# Patient Record
Sex: Female | Born: 1955 | Race: White | Hispanic: No | Marital: Married | Smoking: Never smoker
Health system: Southern US, Community
[De-identification: ages and names within clinical notes are randomized; demographics above are authoritative.]

## PROBLEM LIST (undated history)

## (undated) DIAGNOSIS — K219 Gastro-esophageal reflux disease without esophagitis: Secondary | ICD-10-CM

## (undated) DIAGNOSIS — E8881 Metabolic syndrome: Secondary | ICD-10-CM

## (undated) DIAGNOSIS — F32A Depression, unspecified: Secondary | ICD-10-CM

## (undated) DIAGNOSIS — F329 Major depressive disorder, single episode, unspecified: Secondary | ICD-10-CM

## (undated) DIAGNOSIS — R011 Cardiac murmur, unspecified: Secondary | ICD-10-CM

## (undated) DIAGNOSIS — I272 Pulmonary hypertension, unspecified: Secondary | ICD-10-CM

## (undated) DIAGNOSIS — R109 Unspecified abdominal pain: Secondary | ICD-10-CM

## (undated) DIAGNOSIS — C801 Malignant (primary) neoplasm, unspecified: Secondary | ICD-10-CM

## (undated) DIAGNOSIS — E669 Obesity, unspecified: Secondary | ICD-10-CM

## (undated) DIAGNOSIS — I10 Essential (primary) hypertension: Secondary | ICD-10-CM

## (undated) DIAGNOSIS — R569 Unspecified convulsions: Secondary | ICD-10-CM

## (undated) DIAGNOSIS — E559 Vitamin D deficiency, unspecified: Secondary | ICD-10-CM

## (undated) DIAGNOSIS — R5383 Other fatigue: Secondary | ICD-10-CM

## (undated) DIAGNOSIS — E785 Hyperlipidemia, unspecified: Secondary | ICD-10-CM

## (undated) DIAGNOSIS — D509 Iron deficiency anemia, unspecified: Secondary | ICD-10-CM

## (undated) HISTORY — PX: THYROIDECTOMY, PARTIAL: SHX18

## (undated) HISTORY — PX: APPENDECTOMY: SHX54

## (undated) HISTORY — PX: CHOLECYSTECTOMY: SHX55

## (undated) HISTORY — PX: GASTRIC BYPASS: SHX52

## (undated) HISTORY — PX: ABDOMINAL HYSTERECTOMY: SHX81

---

## 2012-04-18 ENCOUNTER — Emergency Department (HOSPITAL_BASED_OUTPATIENT_CLINIC_OR_DEPARTMENT_OTHER): Payer: BC Managed Care – PPO

## 2012-04-18 ENCOUNTER — Encounter (HOSPITAL_BASED_OUTPATIENT_CLINIC_OR_DEPARTMENT_OTHER): Payer: Self-pay | Admitting: Emergency Medicine

## 2012-04-18 ENCOUNTER — Emergency Department (HOSPITAL_BASED_OUTPATIENT_CLINIC_OR_DEPARTMENT_OTHER)
Admission: EM | Admit: 2012-04-18 | Discharge: 2012-04-18 | Disposition: A | Discharge status: Home / Self Care | Payer: BC Managed Care – PPO | Attending: Emergency Medicine | Admitting: Emergency Medicine

## 2012-04-18 DIAGNOSIS — F329 Major depressive disorder, single episode, unspecified: Secondary | ICD-10-CM | POA: Insufficient documentation

## 2012-04-18 DIAGNOSIS — R112 Nausea with vomiting, unspecified: Secondary | ICD-10-CM | POA: Insufficient documentation

## 2012-04-18 DIAGNOSIS — Z862 Personal history of diseases of the blood and blood-forming organs and certain disorders involving the immune mechanism: Secondary | ICD-10-CM | POA: Insufficient documentation

## 2012-04-18 DIAGNOSIS — Z79899 Other long term (current) drug therapy: Secondary | ICD-10-CM | POA: Insufficient documentation

## 2012-04-18 DIAGNOSIS — F3289 Other specified depressive episodes: Secondary | ICD-10-CM | POA: Insufficient documentation

## 2012-04-18 DIAGNOSIS — E8881 Metabolic syndrome: Secondary | ICD-10-CM | POA: Insufficient documentation

## 2012-04-18 DIAGNOSIS — I2789 Other specified pulmonary heart diseases: Secondary | ICD-10-CM | POA: Insufficient documentation

## 2012-04-18 DIAGNOSIS — Z8639 Personal history of other endocrine, nutritional and metabolic disease: Secondary | ICD-10-CM | POA: Insufficient documentation

## 2012-04-18 DIAGNOSIS — R1013 Epigastric pain: Secondary | ICD-10-CM

## 2012-04-18 DIAGNOSIS — E669 Obesity, unspecified: Secondary | ICD-10-CM | POA: Insufficient documentation

## 2012-04-18 DIAGNOSIS — K2970 Gastritis, unspecified, without bleeding: Secondary | ICD-10-CM | POA: Insufficient documentation

## 2012-04-18 DIAGNOSIS — K219 Gastro-esophageal reflux disease without esophagitis: Secondary | ICD-10-CM | POA: Insufficient documentation

## 2012-04-18 DIAGNOSIS — Z8669 Personal history of other diseases of the nervous system and sense organs: Secondary | ICD-10-CM | POA: Insufficient documentation

## 2012-04-18 DIAGNOSIS — E785 Hyperlipidemia, unspecified: Secondary | ICD-10-CM | POA: Insufficient documentation

## 2012-04-18 DIAGNOSIS — R011 Cardiac murmur, unspecified: Secondary | ICD-10-CM | POA: Insufficient documentation

## 2012-04-18 HISTORY — DX: Hyperlipidemia, unspecified: E78.5

## 2012-04-18 HISTORY — DX: Other fatigue: R53.83

## 2012-04-18 HISTORY — DX: Cardiac murmur, unspecified: R01.1

## 2012-04-18 HISTORY — DX: Depression, unspecified: F32.A

## 2012-04-18 HISTORY — DX: Obesity, unspecified: E66.9

## 2012-04-18 HISTORY — DX: Gastro-esophageal reflux disease without esophagitis: K21.9

## 2012-04-18 HISTORY — DX: Unspecified convulsions: R56.9

## 2012-04-18 HISTORY — DX: Unspecified abdominal pain: R10.9

## 2012-04-18 HISTORY — DX: Metabolic syndrome: E88.81

## 2012-04-18 HISTORY — DX: Iron deficiency anemia, unspecified: D50.9

## 2012-04-18 HISTORY — DX: Vitamin D deficiency, unspecified: E55.9

## 2012-04-18 HISTORY — DX: Major depressive disorder, single episode, unspecified: F32.9

## 2012-04-18 HISTORY — DX: Metabolic syndrome: E88.810

## 2012-04-18 HISTORY — DX: Pulmonary hypertension, unspecified: I27.20

## 2012-04-18 HISTORY — DX: Essential (primary) hypertension: I10

## 2012-04-18 LAB — CBC WITH DIFFERENTIAL/PLATELET
Basophils Absolute: 0 10*3/uL (ref 0.0–0.1)
HCT: 38.5 % (ref 36.0–46.0)
Lymphocytes Relative: 15 % (ref 12–46)
Monocytes Absolute: 0.9 10*3/uL (ref 0.1–1.0)
Neutro Abs: 7.2 10*3/uL (ref 1.7–7.7)
Neutrophils Relative %: 75 % (ref 43–77)
Platelets: 183 10*3/uL (ref 150–400)
RDW: 13.5 % (ref 11.5–15.5)
WBC: 9.6 10*3/uL (ref 4.0–10.5)

## 2012-04-18 LAB — URINALYSIS, ROUTINE W REFLEX MICROSCOPIC
Bilirubin Urine: NEGATIVE
Glucose, UA: NEGATIVE mg/dL
Ketones, ur: NEGATIVE mg/dL
Leukocytes, UA: NEGATIVE
Specific Gravity, Urine: 1.021 (ref 1.005–1.030)
pH: 5 (ref 5.0–8.0)

## 2012-04-18 LAB — COMPREHENSIVE METABOLIC PANEL
ALT: 59 U/L — ABNORMAL HIGH (ref 0–35)
AST: 17 U/L (ref 0–37)
CO2: 21 mEq/L (ref 19–32)
Chloride: 95 mEq/L — ABNORMAL LOW (ref 96–112)
GFR calc non Af Amer: 63 mL/min — ABNORMAL LOW (ref >90)
Potassium: 4.6 mEq/L (ref 3.5–5.1)
Sodium: 128 mEq/L — ABNORMAL LOW (ref 135–145)
Total Bilirubin: 0.3 mg/dL (ref 0.3–1.2)

## 2012-04-18 MED ORDER — HYDROMORPHONE HCL PF 1 MG/ML IJ SOLN
1.0000 mg | Freq: Once | INTRAMUSCULAR | Status: AC
  Administered 2012-04-18: 1 mg via INTRAVENOUS
  Filled 2012-04-18: qty 1

## 2012-04-18 MED ORDER — GI COCKTAIL ~~LOC~~
30.0000 mL | Freq: Once | ORAL | Status: AC
  Administered 2012-04-18: 30 mL via ORAL
  Filled 2012-04-18: qty 30

## 2012-04-18 MED ORDER — PROMETHAZINE HCL 25 MG/ML IJ SOLN
25.0000 mg | Freq: Once | INTRAMUSCULAR | Status: AC
  Administered 2012-04-18: 25 mg via INTRAVENOUS
  Filled 2012-04-18: qty 1

## 2012-04-18 MED ORDER — HYDROMORPHONE HCL PF 1 MG/ML IJ SOLN
1.0000 mg | Freq: Once | INTRAMUSCULAR | Status: AC
  Administered 2012-04-18: 1 mg via INTRAVENOUS
  Filled 2012-04-18 (×2): qty 1

## 2012-04-18 MED ORDER — ONDANSETRON HCL 4 MG/2ML IJ SOLN
4.0000 mg | Freq: Once | INTRAMUSCULAR | Status: AC
  Administered 2012-04-18: 4 mg via INTRAVENOUS
  Filled 2012-04-18: qty 2

## 2012-04-18 MED ORDER — LOPERAMIDE HCL 2 MG PO CAPS: ORAL_CAPSULE | ORAL | Status: DC

## 2012-04-18 MED ORDER — IOHEXOL 300 MG/ML  SOLN
100.0000 mL | Freq: Once | INTRAMUSCULAR | Status: AC | PRN
  Administered 2012-04-18: 100 mL via INTRAVENOUS

## 2012-04-18 MED ORDER — HYDROMORPHONE HCL PF 1 MG/ML IJ SOLN
1.0000 mg | Freq: Once | INTRAMUSCULAR | Status: AC
  Administered 2012-04-18: 1 mg via INTRAVENOUS

## 2012-04-18 MED ORDER — SODIUM CHLORIDE 0.9 % IV BOLUS (SEPSIS)
1000.0000 mL | Freq: Once | INTRAVENOUS | Status: AC
  Administered 2012-04-18: 1000 mL via INTRAVENOUS

## 2012-04-18 MED ORDER — ONDANSETRON HCL 4 MG PO TABS: 4.0000 mg | ORAL_TABLET | Freq: Four times a day (QID) | ORAL | Status: DC

## 2012-04-18 MED ORDER — GI COCKTAIL ~~LOC~~: 30.0000 mL | Freq: Two times a day (BID) | ORAL | Status: DC

## 2012-04-18 MED ORDER — OXYCODONE-ACETAMINOPHEN 5-325 MG PO TABS: 1.0000 | ORAL_TABLET | Freq: Four times a day (QID) | ORAL | Status: DC | PRN

## 2012-04-18 MED ORDER — PANTOPRAZOLE SODIUM 40 MG IV SOLR
40.0000 mg | Freq: Once | INTRAVENOUS | Status: AC
  Administered 2012-04-18: 40 mg via INTRAVENOUS
  Filled 2012-04-18: qty 40

## 2012-04-18 MED ORDER — IOHEXOL 300 MG/ML  SOLN
25.0000 mL | INTRAMUSCULAR | Status: AC
  Administered 2012-04-18 (×2): 25 mL via ORAL

## 2012-04-18 MED ORDER — DICYCLOMINE HCL 20 MG PO TABS: 20.0000 mg | ORAL_TABLET | Freq: Two times a day (BID) | ORAL | Status: AC

## 2012-04-18 MED ORDER — DICYCLOMINE HCL 10 MG PO CAPS
10.0000 mg | ORAL_CAPSULE | Freq: Once | ORAL | Status: AC
  Administered 2012-04-18: 10 mg via ORAL
  Filled 2012-04-18: qty 1

## 2012-04-18 NOTE — ED Notes (Signed)
Pt's BP decreased s/p dilaudid & Phenergan - bolus started, Tia, PA notified, pt will not be d/c'd until BPs are stable.

## 2012-04-18 NOTE — ED Provider Notes (Signed)
History     CSN: [id number]  Arrival date & time 04/18/12  1210   First MD Initiated Contact with Patient 04/18/12 1222      Chief Complaint  Patient presents with  . Abdominal Pain    (Consider location/radiation/quality/duration/timing/severity/associated sxs/prior treatment) HPI Comments: Patient with history of GERD, presents with complaint of epigastric pain X 8 days. Associated symptoms include nausea, vomiting, and a "burning raw" sensation in her throat. Patient states that she was seen at [place] [facility], given a GI cocktail and sent home. She then went to her PCP who recommended nexium, also no improvement. Of note patient has had multiple abdominal surgeries to include cholecystectomy, appendectomy, gastric bypass, and an abdominal hysterectomy. Denies fever or chills. Denies hematemesis or hematochezia. Denies CP or SOB.   The history is provided by the patient. No language interpreter was used.    Past Medical History  Diagnosis Date  . Dysmetabolic syndrome X   . Seizures   . Iron deficiency anemia   . Heart murmur   . Obesity   . Hyperlipemia   . Pulmonary hypertension   . Depression   . GERD (gastroesophageal reflux disease)   . Vitamin D deficiency   . Fatigue   . Hypertension   . Abdominal pain     Past Surgical History  Procedure Date  . Abdominal hysterectomy   . Appendectomy   . Cholecystectomy   . Gastric bypass     No family history on file.  History  Substance Use Topics  . Smoking status: Not on file  . Smokeless tobacco: Not on file  . Alcohol Use:     OB History    Grav Para Term Preterm Abortions TAB SAB Ect Mult Living                  Review of Systems  Constitutional: Negative for fever and chills.  Respiratory: Negative for shortness of breath.   Cardiovascular: Negative for chest pain.  Gastrointestinal: Positive for nausea, vomiting and abdominal pain. Negative for diarrhea.    Allergies  Review of patient's  allergies indicates no known allergies.  Home Medications   Current Outpatient Rx  Name  Route  Sig  Dispense  Refill  . CITALOPRAM HYDROBROMIDE 20 MG PO TABS   Oral   Take 20 mg by mouth daily.         Marland Kitchen CLONIDINE HCL 0.1 MG PO TABS   Oral   Take 0.1 mg by mouth 3 (three) times daily.         Marland Kitchen ESOMEPRAZOLE MAGNESIUM 40 MG PO CPDR   Oral   Take 40 mg by mouth daily before breakfast.         . LISINOPRIL-HYDROCHLOROTHIAZIDE 20-25 MG PO TABS   Oral   Take 1 tablet by mouth daily.         Marland Kitchen SIMETHICONE 62.5 MG PO STRP   Oral   Take 2 strips by mouth daily.         Marland Kitchen TAPENTADOL HCL 50 MG PO TABS   Oral   Take 300 mg by mouth 2 (two) times daily.         Marland Kitchen ZOLPIDEM TARTRATE ER 12.5 MG PO TBCR   Oral   Take 12.5 mg by mouth at bedtime as needed.           BP 94/70  Pulse 98  Temp 98 F (36.7 C) (Oral)  Resp 16  Ht 5\' 7"  (1.702  m)  Wt 231 lb (104.781 kg)  BMI 36.18 kg/m2  SpO2 96%  Physical Exam  Nursing note and vitals reviewed. Constitutional: She is oriented to person, place, and time. She appears well-developed and well-nourished.  HENT:  Head: Normocephalic and atraumatic.  Mouth/Throat: Oropharynx is clear and moist. No oropharyngeal exudate.  Eyes: Conjunctivae normal and EOM are normal. No scleral icterus.  Neck: Normal range of motion. Neck supple.  Cardiovascular: Normal rate, regular rhythm and normal heart sounds.   Pulmonary/Chest: Effort normal and breath sounds normal.  Abdominal: Soft. Bowel sounds are normal. She exhibits no distension and no mass. There is tenderness. There is no rebound and no guarding.       Tenderness to palpation of the epigastrium and LUQ.  Lymphadenopathy:    She has no cervical adenopathy.  Neurological: She is alert and oriented to person, place, and time.  Skin: Skin is warm and dry.    ED Course  Procedures (including critical care time)   Labs Reviewed  CBC WITH DIFFERENTIAL  COMPREHENSIVE  METABOLIC PANEL  LIPASE, BLOOD  LACTIC ACID, PLASMA  URINALYSIS, ROUTINE W REFLEX MICROSCOPIC   Date: 04/18/2012  Rate: 86  Rhythm: normal sinus rhythm  QRS Axis: normal  Intervals: normal  ST/T Wave abnormalities: normal  Conduction Disutrbances:none  Narrative Interpretation: NO STEMI  Old EKG Reviewed: none available   Ct Abdomen Pelvis W Contrast  04/18/2012  *RADIOLOGY REPORT*  Clinical Data: Right-sided abdominal pain with nausea, vomiting, diarrhea.  CT ABDOMEN AND PELVIS WITH CONTRAST  Technique:  Multidetector CT imaging of the abdomen and pelvis was performed following the standard protocol during bolus administration of intravenous contrast.  Contrast: OMNIPAQUE IOHEXOL 300 MG/ML  SOLN  Comparison: Radiographs dated 04/18/2012  Findings: The liver, spleen, pancreas, right adrenal gland, and kidneys are normal.  Gallbladder has been removed.  No significant dilatation of the biliary tree.  The cecum lies in the right upper quadrant anterior to the right lobe of the liver.  Colon is quite redundant but otherwise normal. No diverticular disease.  No free air or free fluid.  Terminal ileum is normal.  Evidence of previous gastric bypass.  No dilated small bowel.  There is a 3.7 cm mass arising from the superior aspect of the left adrenal gland.  This is unchanged since prior chest CT of 10/31/2011.  It is relatively low density and most likely an adrenal adenoma.  The patient has a known history of this mass.  IMPRESSION: No acute abnormality of the abdomen.  Cecum lies in the right upper quadrant.  Stable left adrenal mass.   Original Report Authenticated By: Francene Boyers, M.D.    Dg Abd Acute W/chest  04/18/2012  *RADIOLOGY REPORT*  Clinical Data: Abdominal pain.  ACUTE ABDOMEN SERIES (ABDOMEN 2 VIEW & CHEST 1 VIEW)  Comparison: None.  Findings: Contrast is seen within nondilated small bowel loops in the right abdomen.  There is no evidence of dilated bowel loops or free  intraperitoneal air.  Colonic interposition between the right hemidiaphragm and liver is noted.  Right upper quadrant surgical clips are seen from prior cholecystectomy.  Low lung volumes are seen however both lungs are clear.  Heart size mediastinal contours are within normal limits.  IMPRESSION:  1.  Unremarkable bowel gas pattern.  No acute findings. 2.  Low lung volumes.  No active lung disease.   Original Report Authenticated By: Myles Rosenthal, M.D.      1. Epigastric abdominal pain  2. Gastritis       MDM  Patient presented with complaint of abdominal pain. Seen at [facility] clinic and physician there concerned about free air under the diaphragm based on results of acute abdominal series.. Labs and imaging repeated. Unremarkable for air under the diaphragm. Lipase elevated but imaging unremarkable for pancreatitis. Pain well controlled and patient feels ready for discharge. Patient has follow-up for upper endoscopy on Thurs. Discharged with pain medication, GI cocktail, Bentyl, and Zofran. Visit shared with Dr. Manus Gunning who agrees with this plan.         Pixie Casino, PA-C 04/18/12 1717

## 2012-04-18 NOTE — ED Notes (Signed)
Pt sent from [facility] for further evaluation of Abdominal pain.  Pt having burning in her chest and neck.  Pt also states she has acid taste in mouth.

## 2012-04-18 NOTE — ED Notes (Signed)
Pt drinking water at bedside

## 2012-04-19 NOTE — ED Provider Notes (Signed)
Medical screening examination/treatment/procedure(s) were conducted as a shared visit with non-physician practitioner(s) and myself.  I personally evaluated the patient during the encounter  Epigastric pain x 1 week with "burning" in throat.  Sent from UC with concern for free air. None seen here. Abdomen soft. Epigastric tenderness, no peritoneal signs.  Hx gastric bypass.  EKG nonischemic. Needs CT.  Glynn Octave, MD 04/19/12 [phone]

## 2013-08-04 ENCOUNTER — Emergency Department (HOSPITAL_BASED_OUTPATIENT_CLINIC_OR_DEPARTMENT_OTHER): Payer: BC Managed Care – PPO

## 2013-08-04 ENCOUNTER — Encounter (HOSPITAL_BASED_OUTPATIENT_CLINIC_OR_DEPARTMENT_OTHER): Payer: Self-pay | Admitting: Emergency Medicine

## 2013-08-04 ENCOUNTER — Emergency Department (HOSPITAL_BASED_OUTPATIENT_CLINIC_OR_DEPARTMENT_OTHER)
Admission: EM | Admit: 2013-08-04 | Discharge: 2013-08-04 | Disposition: A | Discharge status: Home / Self Care | Payer: BC Managed Care – PPO | Attending: Emergency Medicine | Admitting: Emergency Medicine

## 2013-08-04 DIAGNOSIS — S298XXA Other specified injuries of thorax, initial encounter: Secondary | ICD-10-CM | POA: Insufficient documentation

## 2013-08-04 DIAGNOSIS — F3289 Other specified depressive episodes: Secondary | ICD-10-CM | POA: Insufficient documentation

## 2013-08-04 DIAGNOSIS — Y9301 Activity, walking, marching and hiking: Secondary | ICD-10-CM | POA: Insufficient documentation

## 2013-08-04 DIAGNOSIS — IMO0002 Reserved for concepts with insufficient information to code with codable children: Secondary | ICD-10-CM | POA: Insufficient documentation

## 2013-08-04 DIAGNOSIS — Z79899 Other long term (current) drug therapy: Secondary | ICD-10-CM | POA: Insufficient documentation

## 2013-08-04 DIAGNOSIS — Y9289 Other specified places as the place of occurrence of the external cause: Secondary | ICD-10-CM | POA: Insufficient documentation

## 2013-08-04 DIAGNOSIS — R011 Cardiac murmur, unspecified: Secondary | ICD-10-CM | POA: Insufficient documentation

## 2013-08-04 DIAGNOSIS — E669 Obesity, unspecified: Secondary | ICD-10-CM | POA: Insufficient documentation

## 2013-08-04 DIAGNOSIS — K219 Gastro-esophageal reflux disease without esophagitis: Secondary | ICD-10-CM | POA: Insufficient documentation

## 2013-08-04 DIAGNOSIS — I10 Essential (primary) hypertension: Secondary | ICD-10-CM | POA: Insufficient documentation

## 2013-08-04 DIAGNOSIS — Z862 Personal history of diseases of the blood and blood-forming organs and certain disorders involving the immune mechanism: Secondary | ICD-10-CM | POA: Insufficient documentation

## 2013-08-04 DIAGNOSIS — F329 Major depressive disorder, single episode, unspecified: Secondary | ICD-10-CM | POA: Insufficient documentation

## 2013-08-04 DIAGNOSIS — Z8669 Personal history of other diseases of the nervous system and sense organs: Secondary | ICD-10-CM | POA: Insufficient documentation

## 2013-08-04 DIAGNOSIS — W010XXA Fall on same level from slipping, tripping and stumbling without subsequent striking against object, initial encounter: Secondary | ICD-10-CM | POA: Insufficient documentation

## 2013-08-04 DIAGNOSIS — T07XXXA Unspecified multiple injuries, initial encounter: Secondary | ICD-10-CM | POA: Insufficient documentation

## 2013-08-04 DIAGNOSIS — I2789 Other specified pulmonary heart diseases: Secondary | ICD-10-CM | POA: Insufficient documentation

## 2013-08-04 MED ORDER — MORPHINE SULFATE 10 MG/ML IJ SOLN
10.0000 mg | Freq: Once | INTRAMUSCULAR | Status: AC
  Administered 2013-08-04: 10 mg via INTRAMUSCULAR
  Filled 2013-08-04: qty 1

## 2013-08-04 MED ORDER — NAPROXEN 500 MG PO TABS: 500.0000 mg | ORAL_TABLET | Freq: Two times a day (BID) | ORAL | Status: AC

## 2013-08-04 MED ORDER — PROMETHAZINE HCL 25 MG/ML IJ SOLN
25.0000 mg | Freq: Once | INTRAMUSCULAR | Status: AC
  Administered 2013-08-04: 25 mg via INTRAMUSCULAR
  Filled 2013-08-04: qty 1

## 2013-08-04 MED ORDER — METHOCARBAMOL 500 MG PO TABS: 500.0000 mg | ORAL_TABLET | Freq: Three times a day (TID) | ORAL | Status: DC | PRN

## 2013-08-04 NOTE — ED Notes (Signed)
Pt reports she fell walking out of the vet's office.  States she tripped over feet falling onto left side. C/O left chest wall pain, left hand, left shoulder, low back and bilateral knee pain. Abrasions to left 3,4,5 digits on left hand.

## 2013-08-04 NOTE — ED Notes (Signed)
Patient transported to X-ray 

## 2013-08-04 NOTE — ED Notes (Signed)
Wound care done to pt L arm and fingers of L hand, nonadherent bandages applied. Pt tolerated well.

## 2013-08-04 NOTE — Discharge Instructions (Signed)
No serious injuries are noted on your exam or testing today. Continue your regular pain medicine regimen from your primary care physicians. Rx for anti-inflamatory, and muscle relaxant.  Contusion A contusion is a deep bruise. Contusions are the result of an injury that caused bleeding under the skin. The contusion may turn blue, purple, or yellow. Minor injuries will give you a painless contusion, but more severe contusions may stay painful and swollen for a few weeks.  CAUSES  A contusion is usually caused by a blow, trauma, or direct force to an area of the body. SYMPTOMS   Swelling and redness of the injured area.  Bruising of the injured area.  Tenderness and soreness of the injured area.  Pain. DIAGNOSIS  The diagnosis can be made by taking a history and physical exam. An X-ray, CT scan, or MRI may be needed to determine if there were any associated injuries, such as fractures. TREATMENT  Specific treatment will depend on what area of the body was injured. In general, the best treatment for a contusion is resting, icing, elevating, and applying cold compresses to the injured area. Over-the-counter medicines may also be recommended for pain control. Ask your caregiver what the best treatment is for your contusion. HOME CARE INSTRUCTIONS   Put ice on the injured area.  Put ice in a plastic bag.  Place a towel between your skin and the bag.  Leave the ice on for 15-20 minutes, 03-04 times a day.  Only take over-the-counter or prescription medicines for pain, discomfort, or fever as directed by your caregiver. Your caregiver may recommend avoiding anti-inflammatory medicines (aspirin, ibuprofen, and naproxen) for 48 hours because these medicines may increase bruising.  Rest the injured area.  If possible, elevate the injured area to reduce swelling. SEEK IMMEDIATE MEDICAL CARE IF:   You have increased bruising or swelling.  You have pain that is getting worse.  Your  swelling or pain is not relieved with medicines. MAKE SURE YOU:   Understand these instructions.  Will watch your condition.  Will get help right away if you are not doing well or get worse. Document Released: 01/29/2005 Document Revised: 07/14/2011 Document Reviewed: 02/24/2011 [facility] Patient Information 2014 [place], [place].  Abrasions An abrasion is a cut or scrape of the skin. Abrasions do not go through all layers of the skin. HOME CARE  If a bandage (dressing) was put on your wound, change it as told by your doctor. If the bandage sticks, soak it off with warm.  Wash the area with water and soap 2 times a day. Rinse off the soap. Pat the area dry with a clean towel.  Put on medicated cream (ointment) as told by your doctor.  Change your bandage right away if it gets wet or dirty.  Only take medicine as told by your doctor.  See your doctor within 24 48 hours to get your wound checked.  Check your wound for redness, puffiness (swelling), or yellowish-white fluid (pus). GET HELP RIGHT AWAY IF:   You have more pain in the wound.  You have redness, swelling, or tenderness around the wound.  You have pus coming from the wound.  You have a fever or lasting symptoms for more than 2 3 days.  You have a fever and your symptoms suddenly get worse.  You have a bad smell coming from the wound or bandage. MAKE SURE YOU:   Understand these instructions.  Will watch your condition.  Will get help right away if  you are not doing well or get worse. Document Released: 10/08/2007 Document Revised: 01/14/2012 Document Reviewed: 03/25/2011 [facility] Patient Information 2014 [place], [place].
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
## 2013-12-13 ENCOUNTER — Other Ambulatory Visit (HOSPITAL_COMMUNITY): Payer: Self-pay | Admitting: Internal Medicine

## 2013-12-13 DIAGNOSIS — R1310 Dysphagia, unspecified: Secondary | ICD-10-CM

## 2013-12-19 ENCOUNTER — Ambulatory Visit (HOSPITAL_COMMUNITY)
Admission: RE | Admit: 2013-12-19 | Discharge: 2013-12-19 | Disposition: A | Discharge status: Home / Self Care | Payer: Medicaid Other | Source: Ambulatory Visit | Attending: Internal Medicine | Admitting: Internal Medicine

## 2013-12-19 DIAGNOSIS — K449 Diaphragmatic hernia without obstruction or gangrene: Secondary | ICD-10-CM | POA: Insufficient documentation

## 2013-12-19 DIAGNOSIS — K228 Other specified diseases of esophagus: Secondary | ICD-10-CM | POA: Insufficient documentation

## 2013-12-19 DIAGNOSIS — K2289 Other specified disease of esophagus: Secondary | ICD-10-CM | POA: Insufficient documentation

## 2013-12-19 DIAGNOSIS — R1310 Dysphagia, unspecified: Secondary | ICD-10-CM | POA: Insufficient documentation

## 2014-07-03 ENCOUNTER — Emergency Department (HOSPITAL_COMMUNITY): Payer: No Typology Code available for payment source

## 2014-07-03 ENCOUNTER — Emergency Department (HOSPITAL_COMMUNITY)
Admission: EM | Admit: 2014-07-03 | Discharge: 2014-07-03 | Disposition: A | Discharge status: Home / Self Care | Payer: No Typology Code available for payment source | Attending: Emergency Medicine | Admitting: Emergency Medicine

## 2014-07-03 ENCOUNTER — Encounter (HOSPITAL_COMMUNITY): Payer: Self-pay | Admitting: Emergency Medicine

## 2014-07-03 DIAGNOSIS — F329 Major depressive disorder, single episode, unspecified: Secondary | ICD-10-CM | POA: Diagnosis not present

## 2014-07-03 DIAGNOSIS — Z862 Personal history of diseases of the blood and blood-forming organs and certain disorders involving the immune mechanism: Secondary | ICD-10-CM | POA: Insufficient documentation

## 2014-07-03 DIAGNOSIS — M542 Cervicalgia: Secondary | ICD-10-CM

## 2014-07-03 DIAGNOSIS — S299XXA Unspecified injury of thorax, initial encounter: Secondary | ICD-10-CM | POA: Insufficient documentation

## 2014-07-03 DIAGNOSIS — Z79899 Other long term (current) drug therapy: Secondary | ICD-10-CM | POA: Diagnosis not present

## 2014-07-03 DIAGNOSIS — K219 Gastro-esophageal reflux disease without esophagitis: Secondary | ICD-10-CM | POA: Insufficient documentation

## 2014-07-03 DIAGNOSIS — Y998 Other external cause status: Secondary | ICD-10-CM | POA: Diagnosis not present

## 2014-07-03 DIAGNOSIS — E669 Obesity, unspecified: Secondary | ICD-10-CM | POA: Diagnosis not present

## 2014-07-03 DIAGNOSIS — Z791 Long term (current) use of non-steroidal anti-inflammatories (NSAID): Secondary | ICD-10-CM | POA: Insufficient documentation

## 2014-07-03 DIAGNOSIS — Z8639 Personal history of other endocrine, nutritional and metabolic disease: Secondary | ICD-10-CM | POA: Diagnosis not present

## 2014-07-03 DIAGNOSIS — R011 Cardiac murmur, unspecified: Secondary | ICD-10-CM | POA: Diagnosis not present

## 2014-07-03 DIAGNOSIS — E119 Type 2 diabetes mellitus without complications: Secondary | ICD-10-CM | POA: Insufficient documentation

## 2014-07-03 DIAGNOSIS — Y9389 Activity, other specified: Secondary | ICD-10-CM | POA: Diagnosis not present

## 2014-07-03 DIAGNOSIS — R079 Chest pain, unspecified: Secondary | ICD-10-CM

## 2014-07-03 DIAGNOSIS — Y92410 Unspecified street and highway as the place of occurrence of the external cause: Secondary | ICD-10-CM | POA: Diagnosis not present

## 2014-07-03 DIAGNOSIS — Z8669 Personal history of other diseases of the nervous system and sense organs: Secondary | ICD-10-CM | POA: Diagnosis not present

## 2014-07-03 DIAGNOSIS — R51 Headache: Secondary | ICD-10-CM | POA: Insufficient documentation

## 2014-07-03 DIAGNOSIS — S199XXA Unspecified injury of neck, initial encounter: Secondary | ICD-10-CM | POA: Diagnosis not present

## 2014-07-03 DIAGNOSIS — I10 Essential (primary) hypertension: Secondary | ICD-10-CM | POA: Diagnosis not present

## 2014-07-03 DIAGNOSIS — R519 Headache, unspecified: Secondary | ICD-10-CM

## 2014-07-03 MED ORDER — KETOROLAC TROMETHAMINE 60 MG/2ML IM SOLN
60.0000 mg | Freq: Once | INTRAMUSCULAR | Status: AC
  Administered 2014-07-03: 60 mg via INTRAMUSCULAR
  Filled 2014-07-03: qty 2
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
## 2014-07-03 NOTE — Progress Notes (Signed)
EDCM spoke to patient at bedside.  Patient confirms she has [employer] insurance and that her pcp is located at the [facility], Dr. Luvenia Starch.

## 2015-12-06 IMAGING — RF DG ESOPHAGUS
13 series · 13 of 13 positions shown · non-contrast
Comparison: Chest x-ray dated August 04, 2013

CLINICAL DATA: Dysphagia

EXAM:
ESOPHOGRAM / BARIUM SWALLOW / BARIUM TABLET STUDY
TECHNIQUE: Combined double contrast and single contrast examination performed
using effervescent crystals, thick barium liquid, and thin barium
liquid. The patient was observed with fluoroscopy swallowing a 13mm
barium sulphate tablet.
FLUOROSCOPY TIME:  1 min, 10 seconds

[Series 1: run · 1 of 1 slices shown (1 of 13)]
[im 1/1]
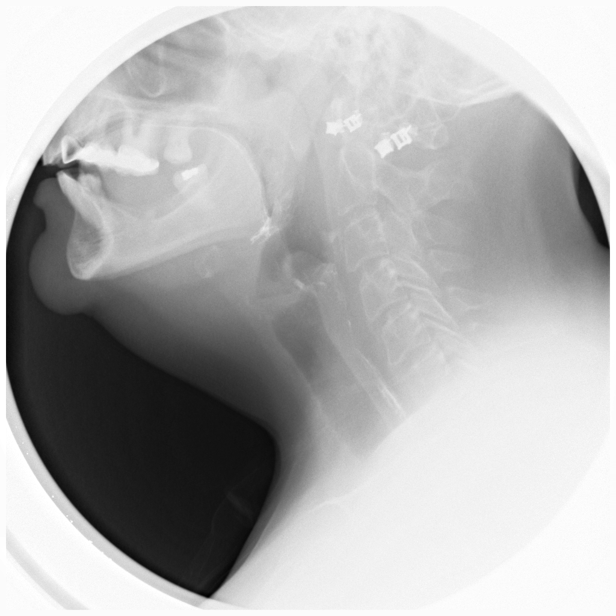

[Series 2: run · 1 of 1 slices shown (2 of 13)]
[im 1/1]
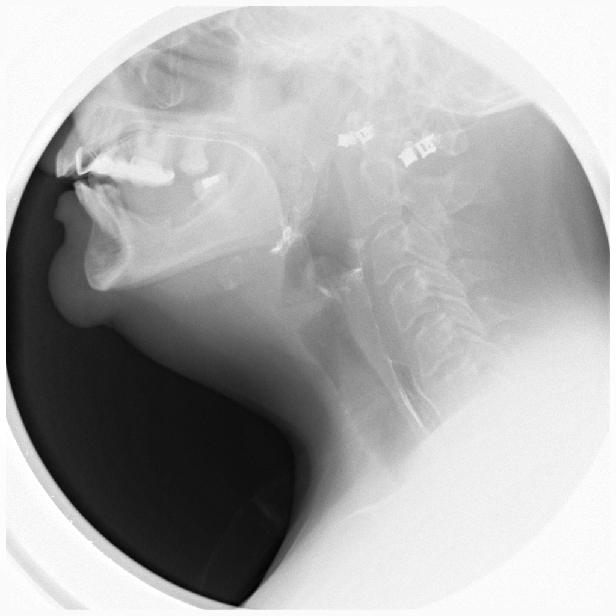

[Series 3: run · 1 of 1 slices shown (3 of 13)]
[im 1/1]
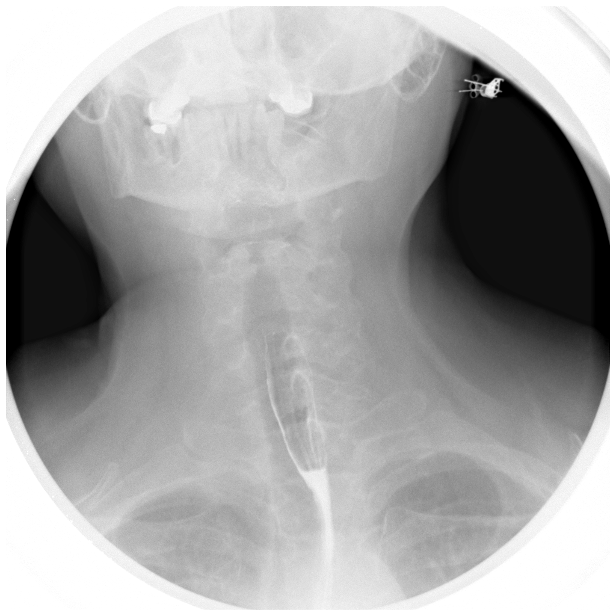

[Series 4: run · 1 of 1 slices shown (4 of 13)]
[im 1/1]
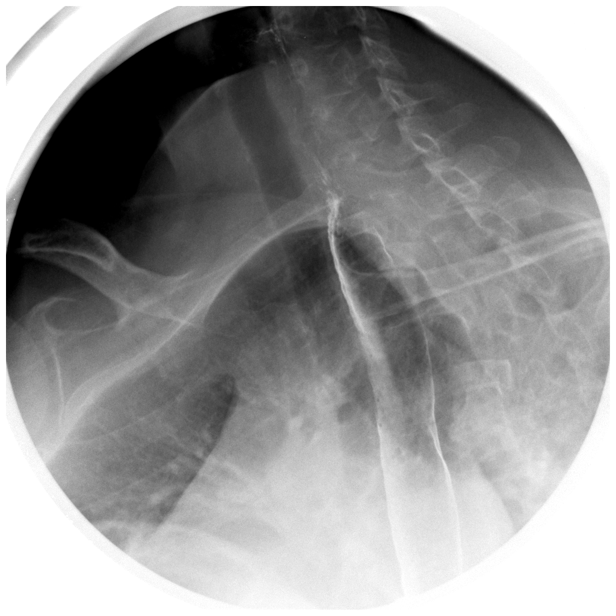

[Series 5: run · 1 of 1 slices shown (5 of 13)]
[im 1/1]
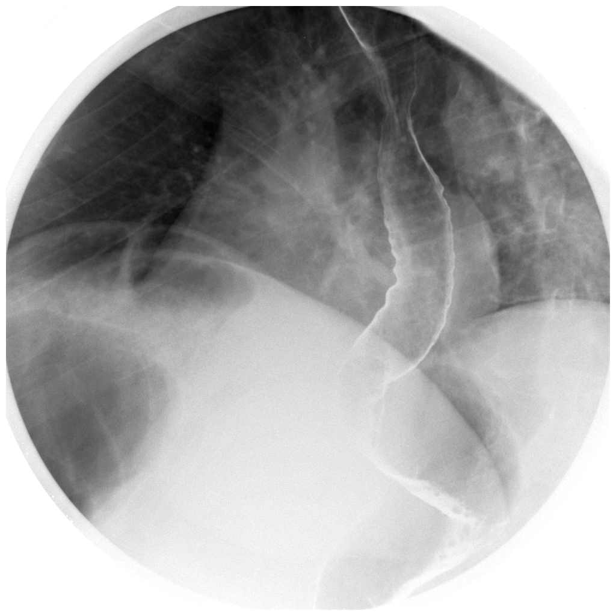

[Series 6: run · 1 of 1 slices shown (6 of 13)]
[im 1/1]
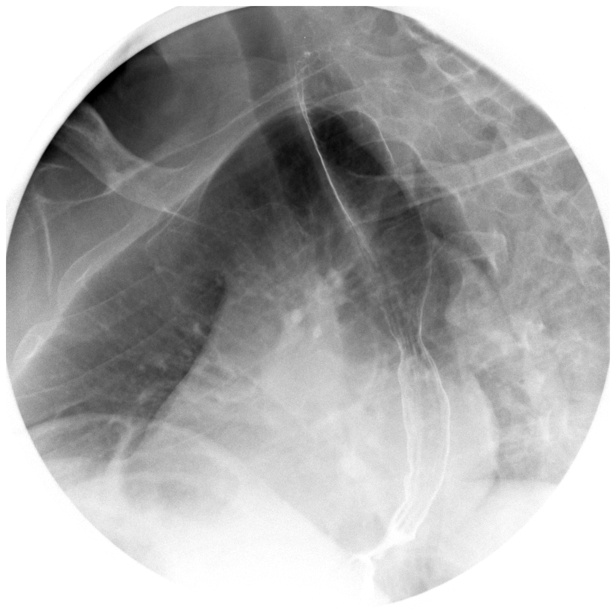

[Series 7: run · 1 of 1 slices shown (7 of 13)]
[im 1/1]
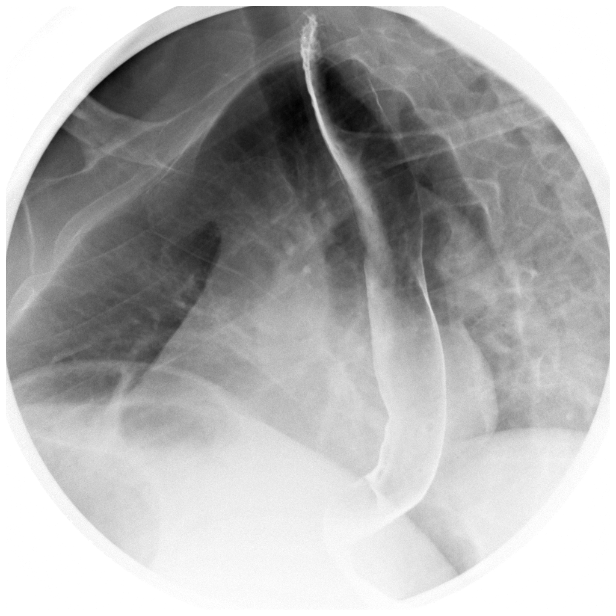

[Series 8: run · 1 of 1 slices shown (8 of 13)]
[im 1/1]
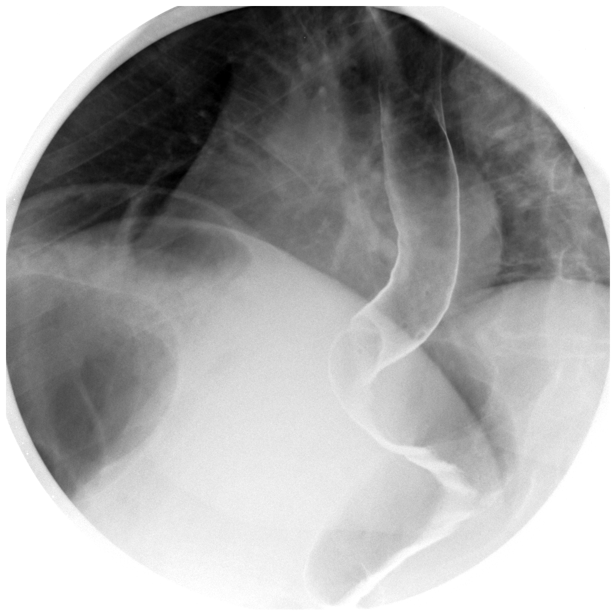

[Series 9: run · 1 of 1 slices shown (9 of 13)]
[im 1/1]
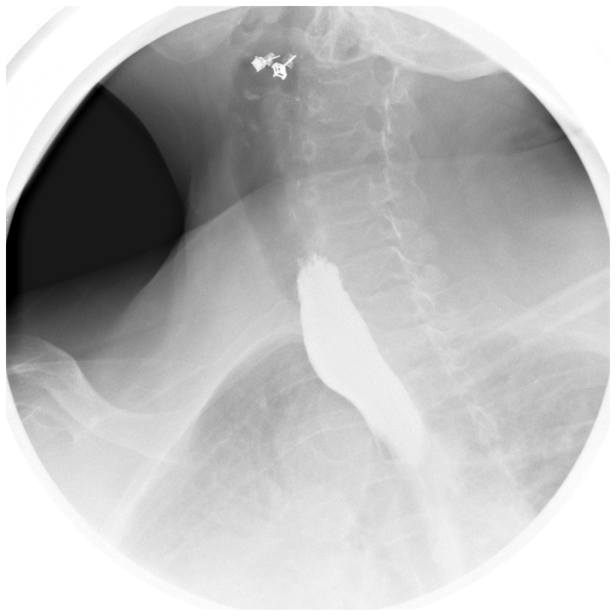

[Series 10: run · 1 of 1 slices shown (10 of 13)]
[im 1/1]
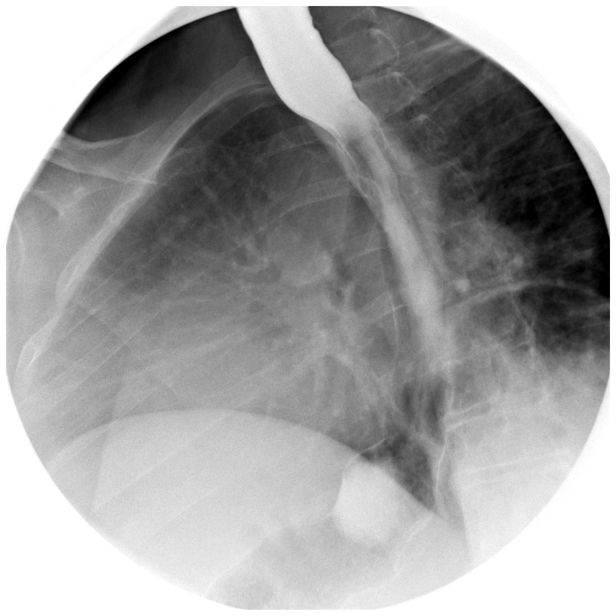

[Series 11: run · 1 of 1 slices shown (11 of 13)]
[im 1/1]
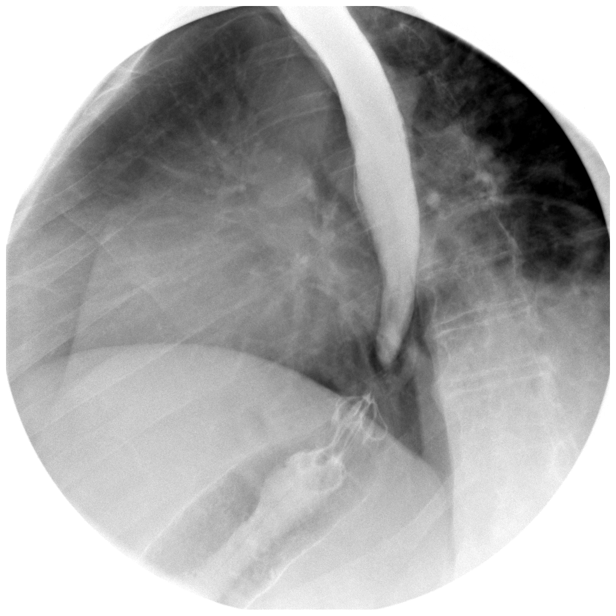

[Series 12: run · 1 of 1 slices shown (12 of 13)]
[im 1/1]
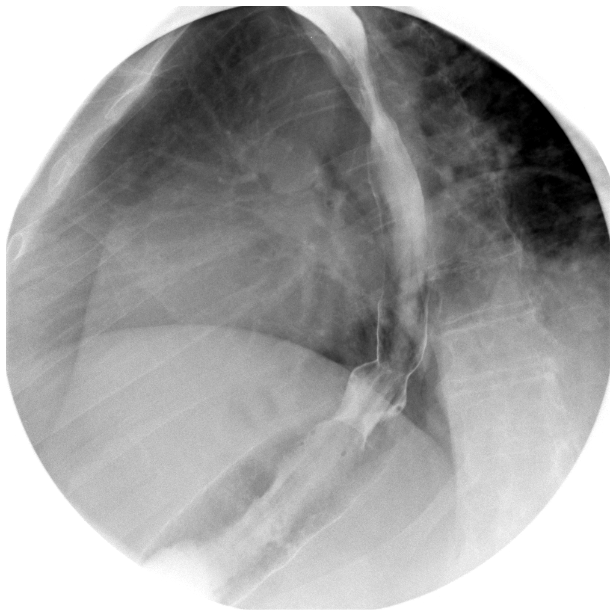

[Series 13: run · 1 of 1 slices shown (13 of 13)]
[im 1/1]
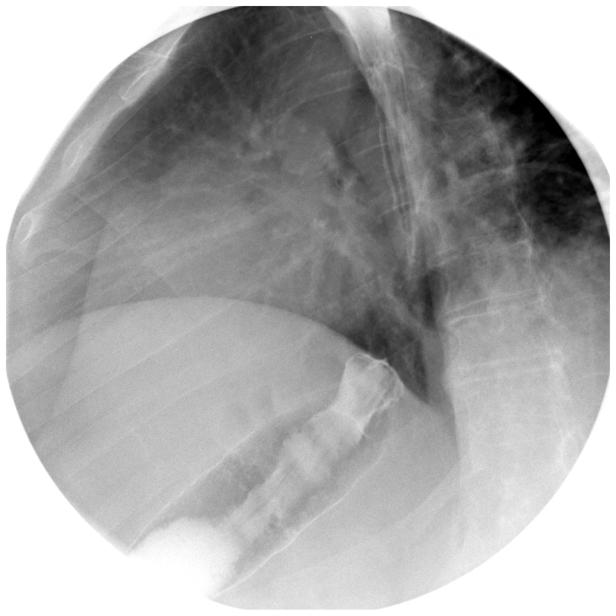

[13 of 13 positions shown; findings below may reference images not displayed]

FINDINGS: The anticipated procedure was discussed with the patient. She voiced
her willingness to proceed. The patient ingested thick and thin
barium and the gas-forming crystals without difficulty. Coating of
the cervical esophagus was quite limited. There was no laryngeal
penetration of the barium. The hypopharyngeal air column and
proximal cervical esophagus demonstrated no abnormal masses.

The thoracic esophagus distended well. The mucosal pattern was
normal. There were a few tertiary contractions demonstrated. A small
reducible hiatal hernia was demonstrated. No reflux was elicited. In
the prone position there was mild delay in the propagation of the
secondary stripping wave. The barium tablet passed without
difficulty.
IMPRESSION: 1. The cervical esophagus is unremarkable.
2. There are mild changes of presbyesophagus.
3. There is a small reducible hiatal hernia without evidence of
esophagitis or reflux.
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
## 2019-04-06 ENCOUNTER — Other Ambulatory Visit: Payer: Self-pay

## 2019-04-06 ENCOUNTER — Emergency Department (HOSPITAL_BASED_OUTPATIENT_CLINIC_OR_DEPARTMENT_OTHER)
Admission: EM | Admit: 2019-04-06 | Discharge: 2019-04-06 | Disposition: A | Discharge status: Home / Self Care | Payer: Medicare HMO | Attending: Emergency Medicine | Admitting: Emergency Medicine

## 2019-04-06 ENCOUNTER — Emergency Department (HOSPITAL_BASED_OUTPATIENT_CLINIC_OR_DEPARTMENT_OTHER): Payer: Medicare HMO

## 2019-04-06 ENCOUNTER — Encounter (HOSPITAL_BASED_OUTPATIENT_CLINIC_OR_DEPARTMENT_OTHER): Payer: Self-pay | Admitting: Emergency Medicine

## 2019-04-06 DIAGNOSIS — E785 Hyperlipidemia, unspecified: Secondary | ICD-10-CM | POA: Diagnosis not present

## 2019-04-06 DIAGNOSIS — Z880 Allergy status to penicillin: Secondary | ICD-10-CM | POA: Diagnosis not present

## 2019-04-06 DIAGNOSIS — Z79899 Other long term (current) drug therapy: Secondary | ICD-10-CM | POA: Insufficient documentation

## 2019-04-06 DIAGNOSIS — Z7901 Long term (current) use of anticoagulants: Secondary | ICD-10-CM | POA: Diagnosis not present

## 2019-04-06 DIAGNOSIS — I10 Essential (primary) hypertension: Secondary | ICD-10-CM | POA: Insufficient documentation

## 2019-04-06 DIAGNOSIS — R011 Cardiac murmur, unspecified: Secondary | ICD-10-CM | POA: Insufficient documentation

## 2019-04-06 DIAGNOSIS — R0789 Other chest pain: Secondary | ICD-10-CM | POA: Diagnosis present

## 2019-04-06 LAB — BASIC METABOLIC PANEL
Anion gap: 9 (ref 5–15)
BUN: 17 mg/dL (ref 8–23)
CO2: 27 mmol/L (ref 22–32)
Calcium: 8.9 mg/dL (ref 8.9–10.3)
Chloride: 100 mmol/L (ref 98–111)
Creatinine, Ser: 0.63 mg/dL (ref 0.44–1.00)
GFR calc Af Amer: >60 mL/min (ref >60)
GFR calc non Af Amer: >60 mL/min (ref >60)
Glucose, Bld: 171 mg/dL — ABNORMAL HIGH (ref 70–99)
Potassium: 4.3 mmol/L (ref 3.5–5.1)
Sodium: 136 mmol/L (ref 135–145)

## 2019-04-06 LAB — HEPATIC FUNCTION PANEL
ALT: 26 U/L (ref 0–44)
AST: 17 U/L (ref 15–41)
Albumin: 3.7 g/dL (ref 3.5–5.0)
Alkaline Phosphatase: 67 U/L (ref 38–126)
Bilirubin, Direct: 0.2 mg/dL (ref 0.0–0.2)
Indirect Bilirubin: 0.3 mg/dL (ref 0.3–0.9)
Total Bilirubin: 0.5 mg/dL (ref 0.3–1.2)
Total Protein: 7.1 g/dL (ref 6.5–8.1)

## 2019-04-06 LAB — CBC WITH DIFFERENTIAL/PLATELET
Abs Immature Granulocytes: 0.03 10*3/uL (ref 0.00–0.07)
Basophils Absolute: 0 10*3/uL (ref 0.0–0.1)
Basophils Relative: 0 %
Eosinophils Absolute: 0 10*3/uL (ref 0.0–0.5)
Eosinophils Relative: 0 %
HCT: 43.5 % (ref 36.0–46.0)
Hemoglobin: 13 g/dL (ref 12.0–15.0)
Immature Granulocytes: 0 %
Lymphocytes Relative: 27 %
Lymphs Abs: 2.3 10*3/uL (ref 0.7–4.0)
MCH: 26.9 pg (ref 26.0–34.0)
MCHC: 29.9 g/dL — ABNORMAL LOW (ref 30.0–36.0)
MCV: 89.9 fL (ref 80.0–100.0)
Monocytes Absolute: 0.6 10*3/uL (ref 0.1–1.0)
Monocytes Relative: 7 %
Neutro Abs: 5.5 10*3/uL (ref 1.7–7.7)
Neutrophils Relative %: 66 %
Platelets: 187 10*3/uL (ref 150–400)
RBC: 4.84 MIL/uL (ref 3.87–5.11)
RDW: 14.3 % (ref 11.5–15.5)
WBC: 8.4 10*3/uL (ref 4.0–10.5)
nRBC: 0 % (ref 0.0–0.2)

## 2019-04-06 LAB — TROPONIN I (HIGH SENSITIVITY): Troponin I (High Sensitivity): 3 ng/L (ref <18)

## 2019-04-06 LAB — LIPASE, BLOOD: Lipase: 46 U/L (ref 11–51)

## 2019-04-06 MED ORDER — HYDROCODONE-ACETAMINOPHEN 5-325 MG PO TABS
1.0000 | ORAL_TABLET | Freq: Once | ORAL | Status: AC
  Administered 2019-04-06: 1 via ORAL
  Filled 2019-04-06: qty 1

## 2019-04-06 NOTE — ED Notes (Signed)
ED Provider at bedside. 

## 2019-04-06 NOTE — ED Provider Notes (Signed)
[facility] EMERGENCY DEPARTMENT Provider Note   CSN: [id number] Arrival date & time: 04/06/19  1104     History   Chief Complaint Chief Complaint  Patient presents with   Chest Pain    HPI Katrina Koch is a 63 y.o. female.     The history is provided by the patient.  Chest Pain Pain location:  L chest Pain quality: aching   Pain radiates to:  Does not radiate Pain severity:  Moderate Onset quality:  Sudden Timing:  Intermittent Progression:  Waxing and waning Chronicity:  New Context: movement and trauma (pain after hugging her son, felt something pop in left side of chest)   Relieved by:  None tried Worsened by:  Movement Associated symptoms: no abdominal pain, no back pain, no cough, no fever, no palpitations, no shortness of breath and no vomiting   Risk factors: high cholesterol and hypertension     Past Medical History:  Diagnosis Date   Abdominal pain    Depression    Dysmetabolic syndrome X    Fatigue    GERD (gastroesophageal reflux disease)    Heart murmur    Hyperlipemia    Hypertension    Iron deficiency anemia    Obesity    Pulmonary hypertension ([place])    Seizures (HCC)    Vitamin D deficiency     There are no active problems to display for this patient.   Past Surgical History:  Procedure Laterality Date   ABDOMINAL HYSTERECTOMY     APPENDECTOMY     CHOLECYSTECTOMY     GASTRIC BYPASS       OB History   No obstetric history on file.      Home Medications    Prior to Admission medications   Medication Sig Start Date End Date Taking? Authorizing Provider  apixaban (ELIQUIS) 5 MG TABS tablet Take 5 mg by mouth 2 (two) times daily.   Yes [provider]  Biotin 10 MG TABS Take 1 tablet by mouth daily.   Yes [provider]  buPROPion (ZYBAN) 150 MG 12 hr tablet Take 150 mg by mouth 2 (two) times daily.   Yes [provider]  busPIRone (BUSPAR) 10 MG tablet Take 10 mg by  mouth 2 (two) times daily.   Yes [provider]  calcium citrate-vitamin D (CITRACAL+D) 315-200 MG-UNIT tablet Take 1 tablet by mouth 2 (two) times daily.   Yes [provider]  Cholecalciferol 50 MCG (2000 UT) TABS Take 1 tablet by mouth daily.   Yes [provider]  Cyanocobalamin 1000 MCG CAPS Take 1 tablet by mouth daily.   Yes [provider]  dexamethasone (DECADRON) 1 MG tablet Take 1 mg by mouth 2 (two) times daily with a meal.   Yes [provider]  dicyclomine (BENTYL) 20 MG tablet Take 1 tablet (20 mg total) by mouth 2 (two) times daily. 04/18/12  Yes Oliveri, Tia L, PA-C  esomeprazole (NEXIUM) 40 MG capsule Take 40 mg by mouth daily before breakfast.   Yes [provider]  ferrous sulfate 325 (65 FE) MG tablet Take 1 mg by mouth daily with breakfast.   Yes [provider]  furosemide (LASIX) 40 MG tablet Take 40 mg by mouth daily.   Yes [provider]  hydrOXYzine (VISTARIL) 50 MG capsule Take 50 mg by mouth 2 (two) times daily.   Yes [provider]  metoprolol (TOPROL-XL) 200 MG 24 hr tablet Take 200 mg by mouth  daily.   Yes [provider]  multivitamin-iron-minerals-folic acid (CENTRUM) chewable tablet Chew 1 tablet by mouth daily.   Yes [provider]  omeprazole (PRILOSEC) 40 MG capsule Take 40 mg by mouth 2 (two) times daily.   Yes [provider]  promethazine (PHENERGAN) 25 MG tablet Take 25 mg by mouth every 6 (six) hours as needed for nausea or vomiting.   Yes [provider]  traZODone (DESYREL) 100 MG tablet Take 100 mg by mouth at bedtime.   Yes [provider]  venlafaxine XR (EFFEXOR-XR) 150 MG 24 hr capsule Take 150 mg by mouth 2 (two) times daily.   Yes [provider]  naproxen (NAPROSYN) 500 MG tablet Take 1 tablet (500 mg total) by mouth 2 (two) times daily. 08/04/13   Tanna Furry, MD  Simethicone 62.5 MG STRP Take 2 strips by mouth  daily.    [provider]    Family History History reviewed. No pertinent family history.  Social History Social History   Tobacco Use   Smoking status: Never Smoker  Substance Use Topics   Alcohol use: No   Drug use: No     Allergies   Other and Penicillins   Review of Systems Review of Systems  Constitutional: Negative for chills and fever.  HENT: Negative for ear pain and sore throat.   Eyes: Negative for pain and visual disturbance.  Respiratory: Negative for cough and shortness of breath.   Cardiovascular: Positive for chest pain. Negative for palpitations.  Gastrointestinal: Negative for abdominal pain and vomiting.  Genitourinary: Negative for dysuria and hematuria.  Musculoskeletal: Negative for arthralgias and back pain.  Skin: Negative for color change and rash.  Neurological: Negative for seizures and syncope.  All other systems reviewed and are negative.    Physical Exam Updated Vital Signs  ED Triage Vitals [04/06/19 1112]  Enc Vitals Group     BP      Pulse      Resp      Temp      Temp src      SpO2      Weight 252 lb (114.3 kg)     Height 5\' 7"  (1.702 m)     Head Circumference      Peak Flow      Pain Score 8     Pain Loc      Pain Edu?      Excl. in [place]?     Physical Exam Vitals signs and nursing note reviewed.  Constitutional:      General: She is not in acute distress.    Appearance: She is well-developed.  HENT:     Head: Normocephalic and atraumatic.  Eyes:     Extraocular Movements: Extraocular movements intact.     Conjunctiva/sclera: Conjunctivae normal.     Pupils: Pupils are equal, round, and reactive to light.  Neck:     Musculoskeletal: Normal range of motion and neck supple.  Cardiovascular:     Rate and Rhythm: Normal rate and regular rhythm.     Pulses:          Radial pulses are 2+ on the right side and 2+ on the left side.     Heart sounds: Normal heart sounds. No murmur.  Pulmonary:     Effort:  Pulmonary effort is normal. No respiratory distress.     Breath sounds: Normal breath sounds. No decreased breath sounds or rhonchi.  Chest:     Chest wall:  Tenderness present.  Abdominal:     Palpations: Abdomen is soft.     Tenderness: There is no abdominal tenderness.  Musculoskeletal: Normal range of motion.     Comments: TTP over left side of chest wall  Skin:    General: Skin is warm and dry.     Capillary Refill: Capillary refill takes less than 2 seconds.  Neurological:     General: No focal deficit present.     Mental Status: She is alert.      ED Treatments / Results  Labs (all labs ordered are listed, but only abnormal results are displayed) Labs Reviewed  CBC WITH DIFFERENTIAL/PLATELET - Abnormal; Notable for the following components:      Result Value   MCHC 29.9 (*)    All other components within normal limits  BASIC METABOLIC PANEL - Abnormal; Notable for the following components:   Glucose, Bld 171 (*)    All other components within normal limits  HEPATIC FUNCTION PANEL  LIPASE, BLOOD  TROPONIN I (HIGH SENSITIVITY)    EKG EKG Interpretation  Date/Time:  Wednesday April 06 2019 11:14:25 EST Ventricular Rate:  71 PR Interval:    QRS Duration: 101 QT Interval:  383 QTC Calculation: 417 R Axis:   25 Text Interpretation: Sinus rhythm Confirmed by Lennice Sites [phone]) on 04/06/2019 11:17:36 AM   Radiology Dg Chest 2 View  Result Date: 04/06/2019 CLINICAL DATA:  Chest pain since this morning. EXAM: CHEST - 2 VIEW COMPARISON:  07/03/2014 FINDINGS: The cardiac silhouette, mediastinal and hilar contours are normal. The lungs are clear. No pleural effusion or pneumothorax. The bony thorax is grossly intact. Mild lower thoracic compression deformities are stable. No obvious rib fractures. IMPRESSION: No acute cardiopulmonary findings and intact bony thorax. Electronically Signed   By: Marijo Sanes M.D.   On: 04/06/2019 11:53    Procedures Procedures  (including critical care time)  Medications Ordered in ED Medications  HYDROcodone-acetaminophen (NORCO/VICODIN) 5-325 MG per tablet 1 tablet (1 tablet Oral Given 04/06/19 1142)     Initial Impression / Assessment and Plan / ED Course  I have reviewed the triage vital signs and the nursing notes.  Pertinent labs & imaging results that were available during my care of the patient were reviewed by me and considered in my medical decision making (see chart for details).     MARCELLENE RAGGS is a 62 year old female with history of depression, hypertension, high cholesterol who presents to the ED with chest wall pain.  Patient with normal vitals.  No fever.  Patient states that she has left-sided chest wall pain, rib pain after giving her son a hug.  She felt a pop and immediate pain.  She has tenderness over the left side of the chest wall.  Chest x-ray showed no signs of rib fracture, no pneumothorax.  She had clear breath sounds bilaterally.  EKG showed sinus rhythm.  No ischemic changes.  Troponin normal.  No significant anemia, electrolyte abnormality, kidney injury.  Doubt ACS or PE.  Likely musculoskeletal in nature.  Possibly a pulled muscle versus a subluxed rib versus a contusion.  Pain is reproducible.  Felt slightly better after hydrocodone.  Recommend Tylenol, Motrin, lidocaine patch and follow-up with primary care doctor.  Activity as tolerated.  Discharged from ED in good condition.  Given return precautions.  This chart was dictated using voice recognition software.  Despite best efforts to proofread,  errors can occur which can change the documentation meaning.  Final Clinical Impressions(s) / ED Diagnoses   Final diagnoses:  Chest wall pain    ED Discharge Orders    None       Lennice Sites, DO 04/06/19 1313

## 2019-04-06 NOTE — ED Triage Notes (Signed)
Patient reports her son hugged her this morning and then she felt a "loud thud" in her left breast which "took my breath away".  States the pain is worse on palpation.

## 2019-04-06 NOTE — Discharge Instructions (Addendum)
Take 1000 mg of Tylenol 4 times a day as needed for pain, take 600 mg of Motrin every 8 hours as needed for pain, recommend that you buy over-the-counter lidocaine patches as well

## 2019-11-13 ENCOUNTER — Other Ambulatory Visit: Payer: Self-pay

## 2019-11-13 ENCOUNTER — Emergency Department (HOSPITAL_BASED_OUTPATIENT_CLINIC_OR_DEPARTMENT_OTHER)
Admission: EM | Admit: 2019-11-13 | Discharge: 2019-11-13 | Disposition: A | Discharge status: Home / Self Care | Payer: Medicare HMO | Attending: Emergency Medicine | Admitting: Emergency Medicine

## 2019-11-13 ENCOUNTER — Encounter (HOSPITAL_BASED_OUTPATIENT_CLINIC_OR_DEPARTMENT_OTHER): Payer: Self-pay

## 2019-11-13 DIAGNOSIS — R110 Nausea: Secondary | ICD-10-CM | POA: Insufficient documentation

## 2019-11-13 DIAGNOSIS — L02416 Cutaneous abscess of left lower limb: Secondary | ICD-10-CM | POA: Diagnosis not present

## 2019-11-13 DIAGNOSIS — I10 Essential (primary) hypertension: Secondary | ICD-10-CM | POA: Insufficient documentation

## 2019-11-13 DIAGNOSIS — L0291 Cutaneous abscess, unspecified: Secondary | ICD-10-CM

## 2019-11-13 DIAGNOSIS — Z79899 Other long term (current) drug therapy: Secondary | ICD-10-CM | POA: Insufficient documentation

## 2019-11-13 DIAGNOSIS — L02214 Cutaneous abscess of groin: Secondary | ICD-10-CM | POA: Diagnosis not present

## 2019-11-13 HISTORY — DX: Malignant (primary) neoplasm, unspecified: C80.1

## 2019-11-13 MED ORDER — DOXYCYCLINE HYCLATE 100 MG PO TABS
100.0000 mg | ORAL_TABLET | Freq: Once | ORAL | Status: AC
  Administered 2019-11-13: 100 mg via ORAL
  Filled 2019-11-13: qty 1

## 2019-11-13 MED ORDER — LIDOCAINE-EPINEPHRINE (PF) 1 %-1:200000 IJ SOLN
10.0000 mL | Freq: Once | INTRAMUSCULAR | Status: AC
  Administered 2019-11-13: 10 mL via INTRADERMAL
  Filled 2019-11-13: qty 30

## 2019-11-13 MED ORDER — OXYCODONE-ACETAMINOPHEN 5-325 MG PO TABS
1.0000 | ORAL_TABLET | Freq: Once | ORAL | Status: AC
  Administered 2019-11-13: 1 via ORAL
  Filled 2019-11-13: qty 1

## 2019-11-13 MED ORDER — DOXYCYCLINE HYCLATE 100 MG PO CAPS: 100.0000 mg | ORAL_CAPSULE | Freq: Two times a day (BID) | ORAL | 0 refills | Status: AC

## 2019-11-13 MED ORDER — ONDANSETRON 4 MG PO TBDP
4.0000 mg | ORAL_TABLET | Freq: Once | ORAL | Status: AC
  Administered 2019-11-13: 4 mg via ORAL
  Filled 2019-11-13: qty 1

## 2019-11-13 MED ORDER — LIDOCAINE-EPINEPHRINE (PF) 2 %-1:200000 IJ SOLN: 10.0000 mL | Freq: Once | INTRAMUSCULAR | Status: DC

## 2019-11-13 NOTE — Discharge Instructions (Addendum)
Apply warm compresses to the area or soak the area in warm water to help continue express drainage.   Keep the wound clean and dry. Gently wash the wound with soap and water and make sure to pat it dry.    Take antibiotic and complete the entire course.   You can take Tylenol or Ibuprofen as directed for pain.  Followup with the Emergengy Dept for your primary care doctor.   in 2 days for wound recheck and packing removal.   Return to the Emergency Department if you experienced any worsening/spreading redness or swelling, fever, worsening pain, or any other worsening or concerning symptoms.

## 2019-11-13 NOTE — ED Provider Notes (Signed)
[facility] EMERGENCY DEPARTMENT Provider Note   CSN: [id number] Arrival date & time: 11/13/19  3500     History Chief Complaint  Patient presents with  . Abscess    Katrina Koch is a 64 y.o. female past history of hypertension, hyperlipidemia who presents for evaluation of pain, redness, swelling noted to the left inner thigh x1.5-week.  She reports that she has history of similar types of boil/abscess.  She states that sometimes, she is able to drain them herself.  She reports that this 1 started about a week ago.  She states that since then, is gotten larger, more painful, more swollen.  Her husband tried to express any drainage from it but did not have an opening.  Patient states that it hurts now to sit down on it.  She states she has had some mild nausea but states she has not had any fevers.  The history is provided by the patient.       Past Medical History:  Diagnosis Date  . Abdominal pain   . Cancer ([place])    thyroid  . Depression   . Dysmetabolic syndrome X   . Fatigue   . GERD (gastroesophageal reflux disease)   . Heart murmur   . Hyperlipemia   . Hypertension   . Iron deficiency anemia   . Obesity   . Pulmonary hypertension ([place])   . Seizures ([place])   . Vitamin D deficiency     There are no problems to display for this patient.   Past Surgical History:  Procedure Laterality Date  . ABDOMINAL HYSTERECTOMY    . APPENDECTOMY    . CHOLECYSTECTOMY    . GASTRIC BYPASS    . THYROIDECTOMY, PARTIAL       OB History   No obstetric history on file.     No family history on file.  Social History   Tobacco Use  . Smoking status: Never Smoker  . Smokeless tobacco: Never Used  Substance Use Topics  . Alcohol use: No  . Drug use: No    Home Medications Prior to Admission medications   Medication Sig Start Date End Date Taking? Authorizing Provider  apixaban (ELIQUIS) 5 MG TABS tablet Take 5 mg by mouth 2 (two) times daily.    [provider]  Biotin 10 MG TABS Take 1 tablet by mouth daily.    [provider]  buPROPion (ZYBAN) 150 MG 12 hr tablet Take 150 mg by mouth 2 (two) times daily.    [provider]  busPIRone (BUSPAR) 10 MG tablet Take 10 mg by mouth 2 (two) times daily.    [provider]  calcium citrate-vitamin D (CITRACAL+D) 315-200 MG-UNIT tablet Take 1 tablet by mouth 2 (two) times daily.    [provider]  Cholecalciferol 50 MCG (2000 UT) TABS Take 1 tablet by mouth daily.    [provider]  Cyanocobalamin 1000 MCG CAPS Take 1 tablet by mouth daily.    [provider]  dexamethasone (DECADRON) 1 MG tablet Take 1 mg by mouth 2 (two) times daily with a meal.    [provider]  dicyclomine (BENTYL) 20 MG tablet Take 1 tablet (20 mg total) by mouth 2 (two) times daily. 04/18/12   Oliveri, Tia L, PA-C  doxycycline (VIBRAMYCIN) 100 MG capsule Take 1 capsule (100 mg total) by mouth 2 (two) times daily for 7 days. 11/13/19 11/20/19  Volanda Napoleon, PA-C  esomeprazole (NEXIUM) 40 MG capsule  Take 40 mg by mouth daily before breakfast.    [provider]  ferrous sulfate 325 (65 FE) MG tablet Take 1 mg by mouth daily with breakfast.    [provider]  furosemide (LASIX) 40 MG tablet Take 40 mg by mouth daily.    [provider]  hydrOXYzine (VISTARIL) 50 MG capsule Take 50 mg by mouth 2 (two) times daily.    [provider]  metoprolol (TOPROL-XL) 200 MG 24 hr tablet Take 200 mg by mouth daily.    [provider]  multivitamin-iron-minerals-folic acid (CENTRUM) chewable tablet Chew 1 tablet by mouth daily.    [provider]  naproxen (NAPROSYN) 500 MG tablet Take 1 tablet (500 mg total) by mouth 2 (two) times daily. 08/04/13   Tanna Furry, MD  omeprazole (PRILOSEC) 40 MG capsule Take 40 mg by mouth 2 (two) times daily.    [provider]  promethazine (PHENERGAN) 25 MG tablet Take 25 mg by  mouth every 6 (six) hours as needed for nausea or vomiting.    [provider]  Simethicone 62.5 MG STRP Take 2 strips by mouth daily.    [provider]  traZODone (DESYREL) 100 MG tablet Take 100 mg by mouth at bedtime.    [provider]  venlafaxine XR (EFFEXOR-XR) 150 MG 24 hr capsule Take 150 mg by mouth 2 (two) times daily.    [provider]    Allergies    Bromelains, Corticosteroids, Other, Sulfamethoxazole-trimethoprim, Gabapentin, Metformin, Penicillin g, and Penicillins  Review of Systems   Review of Systems  Constitutional: Negative for fever.  Cardiovascular: Negative for chest pain.  Gastrointestinal: Positive for nausea. Negative for vomiting.  Genitourinary: Negative for dysuria and hematuria.  Skin: Positive for color change.  Neurological: Negative for headaches.  All other systems reviewed and are negative.   Physical Exam Updated Vital Signs BP (!) 157/88 (BP Location: Right Arm)   Pulse 88   Temp 97.7 F (36.5 C) (Oral)   Resp 16   Ht 5\' 7"  (1.702 m)   Wt 120.2 kg   SpO2 96%   BMI 41.50 kg/m   Physical Exam Vitals and nursing note reviewed. Exam conducted with a chaperone present.  Constitutional:      Appearance: She is well-developed.  HENT:     Head: Normocephalic and atraumatic.  Eyes:     General: No scleral icterus.       Right eye: No discharge.        Left eye: No discharge.     Conjunctiva/sclera: Conjunctivae normal.  Pulmonary:     Effort: Pulmonary effort is normal.  Genitourinary:      Comments: The exam was performed with a chaperone present.  3 x 4 cm area of erythema, induration with central fluctuance noted to the inner aspect of the left thigh.  It does not extend into the labia or down into the perianal region. Skin:    General: Skin is warm and dry.  Neurological:     Mental Status: She is alert.  Psychiatric:        Speech: Speech normal.        Behavior: Behavior normal.     ED  Results / Procedures / Treatments   Labs (all labs ordered are listed, but only abnormal results are displayed) Labs Reviewed - No data to display  EKG None  Radiology No results found.  Procedures .Marland KitchenIncision and Drainage  Date/Time: 11/13/2019 10:26 AM Performed by: Evette Cristal,  Tyna Jaksch, PA-C Authorized by: Volanda Napoleon, PA-C   Consent:    Consent obtained:  Verbal   Consent given by:  Patient   Risks discussed:  Bleeding, incomplete drainage, pain and damage to other organs   Alternatives discussed:  No treatment Universal protocol:    Procedure explained and questions answered to patient or proxy's satisfaction: yes     Relevant documents present and verified: yes     Test results available and properly labeled: yes     Imaging studies available: yes     Required blood products, implants, devices, and special equipment available: yes     Site/side marked: yes     Immediately prior to procedure a time out was called: yes     Patient identity confirmed:  Verbally with patient Location:    Type:  Abscess   Location:  Anogenital   Anogenital location: left groin. Pre-procedure details:    Skin preparation:  Betadine Anesthesia (see MAR for exact dosages):    Anesthesia method:  Local infiltration   Local anesthetic:  Lidocaine 2% WITH epi Procedure type:    Complexity:  Complex Procedure details:    Incision types:  Single straight   Incision depth:  Subcutaneous   Scalpel blade:  11   Wound management:  Probed and deloculated, irrigated with saline and extensive cleaning   Drainage:  Purulent   Drainage amount:  Moderate   Packing materials:  1/4 in gauze Post-procedure details:    Patient tolerance of procedure:  Tolerated well, no immediate complications   (including critical care time)  Medications Ordered in ED Medications  oxyCODONE-acetaminophen (PERCOCET/ROXICET) 5-325 MG per tablet 1 tablet (1 tablet Oral Given 11/13/19 1023)  ondansetron  (ZOFRAN-ODT) disintegrating tablet 4 mg (4 mg Oral Given 11/13/19 1022)  lidocaine-EPINEPHrine (XYLOCAINE-EPINEPHrine) 1 %-1:200000 (PF) injection 10 mL (10 mLs Intradermal Given 11/13/19 1023)  doxycycline (VIBRA-TABS) tablet 100 mg (100 mg Oral Given 11/13/19 1105)    ED Course  I have reviewed the triage vital signs and the nursing notes.  Pertinent labs & imaging results that were available during my care of the patient were reviewed by me and considered in my medical decision making (see chart for details).    MDM Rules/Calculators/A&P                          64 year old female who presents for evaluation of area of pain, redness, swelling noted to left inner thigh that has been ongoing for the last week and a half.  No fevers but has had some nausea.  On initially arrival, she is afebrile, nontoxic-appearing.  Vital signs are stable.  On exam, she has a 3 x 4 cm area of erythema, induration with central fluctuance noted to the inner aspect of the left thigh consistent with abscess.  Bedside ultrasound applied which showed cobblestoning surrounding the area as well as a central fluid-filled pocket.  I discussed treatment options with patient and recommended I&D of the area.  Patient is in agreement to plan.  I&D as documented above.  Patient tolerated procedure well.  We will plan to put patient on antibiotics given surrounding cobblestoning appearance on ultrasound consistent with cellulitis.  Patient with history of allergies to Bactrim, penicillin.  Review of her records that she had blood work in 2021 normal LFTs.  We will plan to put her on doxycycline.  Encouraged at home supportive care measures.  Patient started to return to emergency  department 2 days for wound recheck, packing removal. At this time, patient exhibits no emergent life-threatening condition that require further evaluation in ED or admission. Patient had ample opportunity for questions and discussion. All patient's questions  were answered with full understanding. Strict return precautions discussed. Patient expresses understanding and agreement to plan.   EMERGENCY DEPARTMENT US SOFT TISSUE INTERPRETATION "Study: Limited Soft Tissue Ultrasound"  INDICATIONS: Pain and Soft tissue infection Multiple views of the body part were obtained in real-time with a multi-frequency linear probe  PERFORMED BY: Myself IMAGES ARCHIVED?: Yes SIDE:Left BODY PART:Left inner groin/thigh INTERPRETATION:  Abcess present and Cellulitis present   Portions of this note were generated with Dragon dictation software. Dictation errors may occur despite best attempts at proofreading.   Final Clinical Impression(s) / ED Diagnoses Final diagnoses:  Abscess    Rx / DC Orders ED Discharge Orders         Ordered    doxycycline (VIBRAMYCIN) 100 MG capsule  2 times daily     Discontinue  Reprint     11/13/19 1100           Desma Mcgregor 11/13/19 1226    Dorie Rank, MD 11/14/19 1001

## 2019-11-13 NOTE — ED Triage Notes (Signed)
Pt arrives with c/o abscess to left groin. Pt reports she has history of same and can usually drain them at home reports this one has been there for about 2 weeks and is not getting better.

## 2019-11-15 ENCOUNTER — Encounter (HOSPITAL_BASED_OUTPATIENT_CLINIC_OR_DEPARTMENT_OTHER): Payer: Self-pay | Admitting: *Deleted

## 2019-11-15 ENCOUNTER — Other Ambulatory Visit: Payer: Self-pay

## 2019-11-15 ENCOUNTER — Emergency Department (HOSPITAL_BASED_OUTPATIENT_CLINIC_OR_DEPARTMENT_OTHER)
Admission: EM | Admit: 2019-11-15 | Discharge: 2019-11-15 | Disposition: A | Discharge status: Home / Self Care | Payer: Medicare HMO | Attending: Emergency Medicine | Admitting: Emergency Medicine

## 2019-11-15 DIAGNOSIS — Z79899 Other long term (current) drug therapy: Secondary | ICD-10-CM | POA: Insufficient documentation

## 2019-11-15 DIAGNOSIS — Z4800 Encounter for change or removal of nonsurgical wound dressing: Secondary | ICD-10-CM | POA: Insufficient documentation

## 2019-11-15 DIAGNOSIS — Z85850 Personal history of malignant neoplasm of thyroid: Secondary | ICD-10-CM | POA: Diagnosis not present

## 2019-11-15 DIAGNOSIS — R000 Tachycardia, unspecified: Secondary | ICD-10-CM | POA: Diagnosis not present

## 2019-11-15 DIAGNOSIS — I10 Essential (primary) hypertension: Secondary | ICD-10-CM | POA: Diagnosis not present

## 2019-11-15 DIAGNOSIS — Z5189 Encounter for other specified aftercare: Secondary | ICD-10-CM

## 2019-11-15 DIAGNOSIS — Z7901 Long term (current) use of anticoagulants: Secondary | ICD-10-CM | POA: Diagnosis not present

## 2019-11-15 DIAGNOSIS — E119 Type 2 diabetes mellitus without complications: Secondary | ICD-10-CM | POA: Insufficient documentation

## 2019-11-15 NOTE — Discharge Instructions (Addendum)
Continue to care for the abscess as you have been doing. Continue taking antibiotics as prescribed. Continue taking all your home medications as prescribed. Return to the emergency room if you develop fevers, worsening pain, worsening drainage, or any new, worsening, or concerning symptoms.

## 2019-11-15 NOTE — ED Provider Notes (Signed)
[facility] EMERGENCY DEPARTMENT Provider Note   CSN: [id number] Arrival date & time: 11/15/19  1151     History Chief Complaint  Patient presents with   Wound Check    Katrina Koch is a 64 y.o. female presenting for evaluation of wound recheck and packing removal.  Patient states she has an abscess of her left buttock that was drained 2 days ago in the ER.  She reports she initially had significant drainage, but this is since been lessening.  She reports continued pain, although not as severe.  She denies fevers, chills, weakness, confusion.  She does have some nausea, but attributes this to the antibiotic.  She has a history of diabetes, states her blood sugars have been at her baseline.  She feels overall the wound is improving.  HPI     Past Medical History:  Diagnosis Date   Abdominal pain    Cancer ([place])    thyroid   Depression    Dysmetabolic syndrome X    Fatigue    GERD (gastroesophageal reflux disease)    Heart murmur    Hyperlipemia    Hypertension    Iron deficiency anemia    Obesity    Pulmonary hypertension (HCC)    Seizures (HCC)    Vitamin D deficiency     There are no problems to display for this patient.   Past Surgical History:  Procedure Laterality Date   ABDOMINAL HYSTERECTOMY     APPENDECTOMY     CHOLECYSTECTOMY     GASTRIC BYPASS     THYROIDECTOMY, PARTIAL       OB History   No obstetric history on file.     No family history on file.  Social History   Tobacco Use   Smoking status: Never Smoker   Smokeless tobacco: Never Used  Substance Use Topics   Alcohol use: No   Drug use: No    Home Medications Prior to Admission medications   Medication Sig Start Date End Date Taking? Authorizing Provider  apixaban (ELIQUIS) 5 MG TABS tablet Take 5 mg by mouth 2 (two) times daily.    [provider]  Biotin 10 MG TABS Take 1 tablet by mouth daily.    [provider]  buPROPion  (ZYBAN) 150 MG 12 hr tablet Take 150 mg by mouth 2 (two) times daily.    [provider]  busPIRone (BUSPAR) 10 MG tablet Take 10 mg by mouth 2 (two) times daily.    [provider]  calcium citrate-vitamin D (CITRACAL+D) 315-200 MG-UNIT tablet Take 1 tablet by mouth 2 (two) times daily.    [provider]  Cholecalciferol 50 MCG (2000 UT) TABS Take 1 tablet by mouth daily.    [provider]  Cyanocobalamin 1000 MCG CAPS Take 1 tablet by mouth daily.    [provider]  dexamethasone (DECADRON) 1 MG tablet Take 1 mg by mouth 2 (two) times daily with a meal.    [provider]  dicyclomine (BENTYL) 20 MG tablet Take 1 tablet (20 mg total) by mouth 2 (two) times daily. 04/18/12   Oliveri, Tia L, PA-C  doxycycline (VIBRAMYCIN) 100 MG capsule Take 1 capsule (100 mg total) by mouth 2 (two) times daily for 7 days. 11/13/19 11/20/19  Volanda Napoleon, PA-C  esomeprazole (NEXIUM) 40 MG capsule Take 40 mg by mouth daily before breakfast.    [provider]  ferrous sulfate 325 (65 FE) MG tablet Take 1 mg  by mouth daily with breakfast.    [provider]  furosemide (LASIX) 40 MG tablet Take 40 mg by mouth daily.    [provider]  hydrOXYzine (VISTARIL) 50 MG capsule Take 50 mg by mouth 2 (two) times daily.    [provider]  metoprolol (TOPROL-XL) 200 MG 24 hr tablet Take 200 mg by mouth daily.    [provider]  multivitamin-iron-minerals-folic acid (CENTRUM) chewable tablet Chew 1 tablet by mouth daily.    [provider]  naproxen (NAPROSYN) 500 MG tablet Take 1 tablet (500 mg total) by mouth 2 (two) times daily. 08/04/13   Tanna Furry, MD  omeprazole (PRILOSEC) 40 MG capsule Take 40 mg by mouth 2 (two) times daily.    [provider]  promethazine (PHENERGAN) 25 MG tablet Take 25 mg by mouth every 6 (six) hours as needed for nausea or vomiting.    [provider]  Simethicone  62.5 MG STRP Take 2 strips by mouth daily.    [provider]  traZODone (DESYREL) 100 MG tablet Take 100 mg by mouth at bedtime.    [provider]  venlafaxine XR (EFFEXOR-XR) 150 MG 24 hr capsule Take 150 mg by mouth 2 (two) times daily.    [provider]    Allergies    Bromelains, Corticosteroids, Other, Sulfamethoxazole-trimethoprim, Gabapentin, Metformin, Penicillin g, and Penicillins  Review of Systems   Review of Systems  Skin: Positive for wound.  Hematological: Bruises/bleeds easily.    Physical Exam Updated Vital Signs BP (!) 145/108    Pulse (!) 114    Temp 97.7 F (36.5 C) (Oral)    Resp 18    Ht 5\' 7"  (1.702 m)    Wt 120.2 kg    SpO2 97%    BMI 41.50 kg/m   Physical Exam Vitals and nursing note reviewed. Exam conducted with a chaperone present.  Constitutional:      General: She is not in acute distress.    Appearance: She is well-developed. She is obese.     Comments: Obese female resting comfortably in the bed in no acute distress  HENT:     Head: Normocephalic and atraumatic.  Cardiovascular:     Rate and Rhythm: Regular rhythm. Tachycardia present.     Comments: On my exam, mildly tachycardic around 100.   Pulmonary:     Effort: Pulmonary effort is normal.  Abdominal:     General: There is no distension.     Palpations: Abdomen is soft. There is no mass.     Tenderness: There is no abdominal tenderness. There is no guarding or rebound.  Genitourinary:      Comments: Well-healing abscess with no active drainage.  Packing in place.  Removed without further drainage.  Minimal continued induration.  When compared to pictures of previously, abscess appears to be healing well. Musculoskeletal:        General: Normal range of motion.     Cervical back: Normal range of motion.  Skin:    General: Skin is warm.     Capillary Refill: Capillary refill takes less than 2 seconds.     Findings: No rash.  Neurological:     Mental Status:  She is alert and oriented to person, place, and time.     ED Results / Procedures / Treatments   Labs (all labs ordered are listed, but only abnormal results are displayed) Labs Reviewed - No data to display  EKG None  Radiology  No results found.  Procedures Procedures (including critical care time)  Medications Ordered in ED Medications - No data to display  ED Course  I have reviewed the triage vital signs and the nursing notes.  Pertinent labs & imaging results that were available during my care of the patient were reviewed by me and considered in my medical decision making (see chart for details).    MDM Rules/Calculators/A&P                          Patient presenting for evaluation of wound recheck and packing removal.  On exam, wound appears to be healing well.  Packing was removed without incident.  No further drainage.  As wound is overall improving, will have patient continue care instructions and continue antibiotics. On my exam, pt is mildly tachycardic around 100. This improved without intervention in the ED.  Likely pain related. Exam is not consistent with systemic infection, I do not believe she needs further labs or imaging in the ED today.  Discussed findings and plan with patient and husband, who are agreeable.  At this time, patient appears safe for discharge.  Return precautions given.  Patient states she understands and agrees to plan.  Final Clinical Impression(s) / ED Diagnoses Final diagnoses:  None    Rx / DC Orders ED Discharge Orders    None       Franchot Heidelberg, PA-C 11/15/19 1355    Tegeler, Gwenyth Allegra, MD 11/15/19 1534

## 2019-11-15 NOTE — ED Triage Notes (Signed)
Recheck abscess I&D left groin x 2 days.

## 2020-01-24 ENCOUNTER — Emergency Department (HOSPITAL_BASED_OUTPATIENT_CLINIC_OR_DEPARTMENT_OTHER)
Admission: EM | Admit: 2020-01-24 | Discharge: 2020-01-24 | Disposition: A | Discharge status: Home / Self Care | Payer: Medicare HMO | Attending: Emergency Medicine | Admitting: Emergency Medicine

## 2020-01-24 ENCOUNTER — Encounter (HOSPITAL_BASED_OUTPATIENT_CLINIC_OR_DEPARTMENT_OTHER): Payer: Self-pay | Admitting: *Deleted

## 2020-01-24 ENCOUNTER — Other Ambulatory Visit: Payer: Self-pay

## 2020-01-24 DIAGNOSIS — Z5321 Procedure and treatment not carried out due to patient leaving prior to being seen by health care provider: Secondary | ICD-10-CM | POA: Diagnosis not present

## 2020-01-24 DIAGNOSIS — L02414 Cutaneous abscess of left upper limb: Secondary | ICD-10-CM | POA: Diagnosis not present

## 2020-01-24 NOTE — ED Notes (Signed)
Patient requesting to leave and come back tomorrow. She has to get get up for work at 2 am. Encouraged patient to stay. She states she is unable to wait anymore.

## 2020-01-24 NOTE — ED Triage Notes (Signed)
Abscess x 2  to her left upper leg.

## 2020-01-29 ENCOUNTER — Encounter (HOSPITAL_BASED_OUTPATIENT_CLINIC_OR_DEPARTMENT_OTHER): Payer: Self-pay | Admitting: Emergency Medicine

## 2020-01-29 ENCOUNTER — Emergency Department (HOSPITAL_BASED_OUTPATIENT_CLINIC_OR_DEPARTMENT_OTHER)
Admission: EM | Admit: 2020-01-29 | Discharge: 2020-01-29 | Disposition: A | Discharge status: Home / Self Care | Payer: Medicare HMO | Attending: Emergency Medicine | Admitting: Emergency Medicine

## 2020-01-29 ENCOUNTER — Other Ambulatory Visit: Payer: Self-pay

## 2020-01-29 DIAGNOSIS — N764 Abscess of vulva: Secondary | ICD-10-CM | POA: Diagnosis present

## 2020-01-29 DIAGNOSIS — Z85850 Personal history of malignant neoplasm of thyroid: Secondary | ICD-10-CM | POA: Insufficient documentation

## 2020-01-29 DIAGNOSIS — Z7901 Long term (current) use of anticoagulants: Secondary | ICD-10-CM | POA: Diagnosis not present

## 2020-01-29 DIAGNOSIS — I272 Pulmonary hypertension, unspecified: Secondary | ICD-10-CM | POA: Diagnosis not present

## 2020-01-29 DIAGNOSIS — Z79899 Other long term (current) drug therapy: Secondary | ICD-10-CM | POA: Insufficient documentation

## 2020-01-29 DIAGNOSIS — L0291 Cutaneous abscess, unspecified: Secondary | ICD-10-CM

## 2020-01-29 MED ORDER — LIDOCAINE-EPINEPHRINE 2 %-1:100000 IJ SOLN: 10.0000 mL | Freq: Once | INTRAMUSCULAR | Status: DC

## 2020-01-29 MED ORDER — LIDOCAINE-EPINEPHRINE 1 %-1:100000 IJ SOLN: 20.0000 mL | Freq: Once | INTRAMUSCULAR | Status: AC

## 2020-01-29 MED ORDER — LIDOCAINE-EPINEPHRINE 1 %-1:100000 IJ SOLN
INTRAMUSCULAR | Status: AC
  Administered 2020-01-29: 20 mL via INTRADERMAL
  Filled 2020-01-29: qty 1

## 2020-01-29 MED ORDER — CLINDAMYCIN HCL 150 MG PO CAPS: 450.0000 mg | ORAL_CAPSULE | Freq: Three times a day (TID) | ORAL | 0 refills | Status: AC

## 2020-01-29 NOTE — ED Triage Notes (Signed)
Pt here for new abscess to left vaginal area x 1 week.

## 2020-01-29 NOTE — ED Provider Notes (Signed)
[facility] EMERGENCY DEPARTMENT Provider Note   CSN: [id number] Arrival date & time: 01/29/20  1202     History Chief Complaint  Patient presents with  . Recurrent Skin Infections    Katrina Koch is a 64 y.o. female with PMH significant for recurrent vaginal area abscesses and anticoagulation use due to atrial fibrillation episode that occurred last year who presents to the ED with complaints of vaginal area abscess. Patient states that she first noticed it approximately 1 week ago and over the course of the past few days it has gotten progressively larger in size. She denies the typical of fluctuance that she typically sees, but does endorse progressive tenderness. She has been applying warm compresses regularly. She has not noticed any drainage. It is in the left inguinal region, somewhat involving labia majora. She denies any fevers, abdominal pain, pain with defecation, urinary symptoms, vaginal discharge bleeding, or any other symptoms.  HPI     Past Medical History:  Diagnosis Date  . Abdominal pain   . Cancer ([place])    thyroid  . Depression   . Dysmetabolic syndrome X   . Fatigue   . GERD (gastroesophageal reflux disease)   . Heart murmur   . Hyperlipemia   . Hypertension   . Iron deficiency anemia   . Obesity   . Pulmonary hypertension ([place])   . Seizures ([place])   . Vitamin D deficiency     There are no problems to display for this patient.   Past Surgical History:  Procedure Laterality Date  . ABDOMINAL HYSTERECTOMY    . APPENDECTOMY    . CHOLECYSTECTOMY    . GASTRIC BYPASS    . THYROIDECTOMY, PARTIAL       OB History   No obstetric history on file.     No family history on file.  Social History   Tobacco Use  . Smoking status: Never Smoker  . Smokeless tobacco: Never Used  Vaping Use  . Vaping Use: Never used  Substance Use Topics  . Alcohol use: No  . Drug use: No    Home Medications Prior to Admission medications     Medication Sig Start Date End Date Taking? Authorizing Provider  apixaban (ELIQUIS) 5 MG TABS tablet Take 5 mg by mouth 2 (two) times daily.    [provider]  Biotin 10 MG TABS Take 1 tablet by mouth daily.    [provider]  buPROPion (ZYBAN) 150 MG 12 hr tablet Take 150 mg by mouth 2 (two) times daily.    [provider]  busPIRone (BUSPAR) 10 MG tablet Take 10 mg by mouth 2 (two) times daily.    [provider]  calcium citrate-vitamin D (CITRACAL+D) 315-200 MG-UNIT tablet Take 1 tablet by mouth 2 (two) times daily.    [provider]  Cholecalciferol 50 MCG (2000 UT) TABS Take 1 tablet by mouth daily.    [provider]  clindamycin (CLEOCIN) 150 MG capsule Take 3 capsules (450 mg total) by mouth 3 (three) times daily for 7 days. 01/29/20 02/05/20  Corena Herter, PA-C  Cyanocobalamin 1000 MCG CAPS Take 1 tablet by mouth daily.    [provider]  dexamethasone (DECADRON) 1 MG tablet Take 1 mg by mouth 2 (two) times daily with a meal.    [provider]  dicyclomine (BENTYL) 20 MG tablet Take 1 tablet (20 mg total) by mouth 2 (two) times daily. 04/18/12   Kathyrn Lass, PA-C  esomeprazole (NEXIUM) 40 MG capsule Take 40 mg by mouth daily before breakfast.    [provider]  ferrous sulfate 325 (65 FE) MG tablet Take 1 mg by mouth daily with breakfast.    [provider]  furosemide (LASIX) 40 MG tablet Take 40 mg by mouth daily.    [provider]  hydrOXYzine (VISTARIL) 50 MG capsule Take 50 mg by mouth 2 (two) times daily.    [provider]  metoprolol (TOPROL-XL) 200 MG 24 hr tablet Take 200 mg by mouth daily.    [provider]  multivitamin-iron-minerals-folic acid (CENTRUM) chewable tablet Chew 1 tablet by mouth daily.    [provider]  naproxen (NAPROSYN) 500 MG tablet Take 1 tablet (500 mg total) by mouth 2 (two) times daily. 08/04/13   Tanna Furry, MD   omeprazole (PRILOSEC) 40 MG capsule Take 40 mg by mouth 2 (two) times daily.    [provider]  promethazine (PHENERGAN) 25 MG tablet Take 25 mg by mouth every 6 (six) hours as needed for nausea or vomiting.    [provider]  Simethicone 62.5 MG STRP Take 2 strips by mouth daily.    [provider]  traZODone (DESYREL) 100 MG tablet Take 100 mg by mouth at bedtime.    [provider]  venlafaxine XR (EFFEXOR-XR) 150 MG 24 hr capsule Take 150 mg by mouth 2 (two) times daily.    [provider]    Allergies    Bromelains, Corticosteroids, Other, Sulfamethoxazole-trimethoprim, Gabapentin, Metformin, Penicillin g, and Penicillins  Review of Systems   Review of Systems  Constitutional: Negative for fever.  Gastrointestinal: Negative for abdominal pain.  Genitourinary: Negative for difficulty urinating, vaginal bleeding and vaginal pain.  Musculoskeletal: Negative for gait problem.  Skin: Positive for wound.  Neurological: Negative for weakness.  Hematological: Bruises/bleeds easily.    Physical Exam Updated Vital Signs BP (!) 109/93 (BP Location: Left Arm)   Pulse 87   Temp 98.7 F (37.1 C) (Oral)   Resp 16   Ht 5\' 7"  (1.702 m)   Wt 117.9 kg   SpO2 97%   BMI 40.72 kg/m   Physical Exam Vitals and nursing note reviewed. Exam conducted with a chaperone present.  Constitutional:      General: She is not in acute distress.    Appearance: Normal appearance. She is not ill-appearing.  HENT:     Head: Normocephalic and atraumatic.  Eyes:     General: No scleral icterus.    Conjunctiva/sclera: Conjunctivae normal.  Cardiovascular:     Rate and Rhythm: Normal rate and regular rhythm.     Pulses: Normal pulses.     Heart sounds: Normal heart sounds.  Pulmonary:     Effort: Pulmonary effort is normal. No respiratory distress.     Breath sounds: Normal breath sounds.  Genitourinary:    Comments: Patient has a discrete, mobile, 2 x 2  centimeter area mass that is tender to palpation in the left inguinal region. It does appear to be somewhat coming to a head, but no obvious fluctuance noted. No active drainage. Mild overlying erythema, but no induration.   Skin:    General: Skin is dry.  Neurological:     Mental Status: She is alert.     GCS: GCS eye subscore is 4. GCS verbal subscore is 5. GCS motor subscore is 6.  Psychiatric:        Mood and Affect: Mood normal.  Behavior: Behavior normal.        Thought Content: Thought content normal.      ED Results / Procedures / Treatments   Labs (all labs ordered are listed, but only abnormal results are displayed) Labs Reviewed - No data to display  EKG None  Radiology No results found.  Procedures .Marland KitchenIncision and Drainage  Date/Time: 01/29/2020 3:07 PM Performed by: Corena Herter, PA-C Authorized by: Corena Herter, PA-C   Consent:    Consent obtained:  Verbal   Consent given by:  Patient   Risks discussed:  Bleeding, incomplete drainage, pain, damage to other organs and infection   Alternatives discussed:  No treatment Universal protocol:    Procedure explained and questions answered to patient or proxy's satisfaction: yes     Relevant documents present and verified: yes     Test results available and properly labeled: yes     Imaging studies available: yes     Required blood products, implants, devices, and special equipment available: yes     Site/side marked: yes     Immediately prior to procedure a time out was called: yes     Patient identity confirmed:  Verbally with patient Location:    Type:  Abscess   Size:  2 cm   Location:  Anogenital   Anogenital location:  [place] Pre-procedure details:    Skin preparation:  Betadine Anesthesia (see MAR for exact dosages):    Anesthesia method:  Local infiltration   Local anesthetic:  Lidocaine 1% WITH epi Procedure type:    Complexity:  Simple Procedure details:    Incision types:  Single  straight   Incision depth:  Subcutaneous   Scalpel blade:  11   Wound management:  Probed and deloculated, irrigated with saline and extensive cleaning   Drainage:  Bloody   Drainage amount:  Scant   Packing materials:  None Post-procedure details:    Patient tolerance of procedure:  Tolerated well, no immediate complications   (including critical care time)  Medications Ordered in ED Medications  lidocaine-EPINEPHrine (XYLOCAINE W/EPI) 1 %-1:100000 (with pres) injection 20 mL (20 mLs Intradermal Given by Other 01/29/20 1410)    ED Course  I have reviewed the triage vital signs and the nursing notes.  Pertinent labs & imaging results that were available during my care of the patient were reviewed by me and considered in my medical decision making (see chart for details).    MDM Rules/Calculators/A&P                          Given patient's history of recurrent abscesses in the vaginal region in conjunction with her history of progressively growing mass in the inguinal region that is tender to palpation and similar to prior abscesses, attempted I&D. I made a small incision over point of maximal pressure after the area was cleaned and well anesthetized with local anesthesia. The procedure was well-tolerated, but only scant blood obtained. There is no purulence and the procedure was terminated before further exploration. Patient is on blood thinners. The mass appears to be relatively discrete and mobile. Also must consider cyst versus lymphadenopathy. However, her story was more concerning for abscess formation. Given the mild overlying erythema, will prescribe clindamycin as she states that it has helped in the past. Will prescribe clindamycin for coverage. Recommending warm compresses 4 times daily. Plan for follow-up with her primary care provider regarding today's encounter for ongoing evaluation and management.  Strict ED return precautions discussed with patient and her husband. They  voiced understanding and are agreeable to the plan.  Final Clinical Impression(s) / ED Diagnoses Final diagnoses:  Abscess    Rx / DC Orders ED Discharge Orders         Ordered    clindamycin (CLEOCIN) 150 MG capsule  3 times daily        01/29/20 1515           Reita Chard 01/29/20 1515    Lucrezia Starch, MD 01/30/20 [phone]

## 2020-01-29 NOTE — Discharge Instructions (Signed)
Please take your antibiotics, as directed.  Apply warm compresses to the affected area 5 times daily.  I recommend NSAIDs or other over-the-counter medications for symptomatic relief of your discomfort.  I would like for you to follow-up with your primary care provider for ongoing evaluation and management.    Return to the ED or seek immediate medical attention should you experience any new or worsening symptoms.

## 2022-12-05 ENCOUNTER — Ambulatory Visit: Payer: Medicare HMO | Admitting: Podiatry

## 2022-12-05 ENCOUNTER — Encounter: Payer: Self-pay | Admitting: Podiatry

## 2022-12-05 DIAGNOSIS — L600 Ingrowing nail: Secondary | ICD-10-CM

## 2022-12-05 NOTE — Progress Notes (Signed)
  Subjective:  Patient ID: Katrina Koch, female    DOB: 1955-07-21,   MRN: [id number]  Chief Complaint  Patient presents with   Nail Problem    67 y.o. female presents for concern of left ingrown toenail that has been a chronic problem for her. Has had her right taken care of and relates its time to take care of the left. States she has tried trimming an no longer working  . Denies any other pedal complaints. Denies n/v/f/c.   Past Medical History:  Diagnosis Date   Abdominal pain    Cancer ([facility])    thyroid   Depression    Dysmetabolic syndrome X    Fatigue    GERD (gastroesophageal reflux disease)    Heart murmur    Hyperlipemia    Hypertension    Iron deficiency anemia    Obesity    Pulmonary hypertension ([facility])    Seizures ([facility])    Vitamin D deficiency     Objective:  Physical Exam: Vascular: DP/PT pulses 2/4 bilateral. CFT <3 seconds. Normal hair growth on digits. No edema.  Skin. No lacerations or abrasions bilateral feet. Incurvation of medial border of left great toenail. No erythema edema or purulence noted.  Musculoskeletal: MMT 5/5 bilateral lower extremities in DF, PF, Inversion and Eversion. Deceased ROM in DF of ankle joint.  Neurological: Sensation intact to light touch.   Assessment:   1. Ingrown left greater toenail      Plan:  Patient was evaluated and treated and all questions answered. Discussed ingrown toenails etiology and treatment options including procedure for removal vs conservative care.  Patient requesting removal of ingrown nail today. Procedure below.  Discussed procedure and post procedure care and patient expressed understanding.  Will follow-up in 2 weeks for nail check or sooner if any problems arise.    Procedure:  Procedure: partial Nail Avulsion of left hallux medial nail border.  Surgeon: Louann Sjogren, DPM  Pre-op Dx: Ingrown toenail without infection Post-op: Same  Place of Surgery: Office exam room.  Indications for  surgery: Painful and ingrown toenail.    The patient is requesting removal of nail with chemical matrixectomy. Risks and complications were discussed with the patient for which they understand and written consent was obtained. Under sterile conditions a total of 3 mL of  1% lidocaine plain was infiltrated in a hallux block fashion. Once anesthetized, the skin was prepped in sterile fashion. A tourniquet was then applied. Next the medial aspect of hallux nail border was then sharply excised making sure to remove the entire offending nail border.  Next phenol was then applied under standard conditions and copiously irrigated. Silvadene was applied. A dry sterile dressing was applied. After application of the dressing the tourniquet was removed and there is found to be an immediate capillary refill time to the digit. The patient tolerated the procedure well without any complications. Post procedure instructions were discussed the patient for which he verbally understood. Follow-up in two weeks for nail check or sooner if any problems are to arise. Discussed signs/symptoms of infection and directed to call the office immediately should any occur or go directly to the emergency room. In the meantime, encouraged to call the office with any questions, concerns, changes symptoms.   Louann Sjogren, DPM

## 2022-12-05 NOTE — Patient Instructions (Signed)

## 2022-12-19 ENCOUNTER — Ambulatory Visit: Payer: Medicare HMO | Admitting: Podiatry

## 2022-12-19 ENCOUNTER — Encounter: Payer: Self-pay | Admitting: Podiatry

## 2022-12-19 DIAGNOSIS — L600 Ingrowing nail: Secondary | ICD-10-CM

## 2022-12-19 NOTE — Progress Notes (Signed)
  Subjective:  Patient ID: Katrina Koch, female    DOB: 1956-04-15,   MRN: [id number]  Chief Complaint  Patient presents with   Ingrown Toenail    67 y.o. female presents for follow-up of left ingrown nail procedure. Relates doing well and was soaking as instructed.  . Denies any other pedal complaints. Denies n/v/f/c.   Past Medical History:  Diagnosis Date   Abdominal pain    Cancer ([facility])    thyroid   Depression    Dysmetabolic syndrome X    Fatigue    GERD (gastroesophageal reflux disease)    Heart murmur    Hyperlipemia    Hypertension    Iron deficiency anemia    Obesity    Pulmonary hypertension ([facility])    Seizures ([facility])    Vitamin D deficiency     Objective:  Physical Exam: Vascular: DP/PT pulses 2/4 bilateral. CFT <3 seconds. Normal hair growth on digits. No edema.  Skin. No lacerations or abrasions bilateral feet. Left hallux nail healing well.  Musculoskeletal: MMT 5/5 bilateral lower extremities in DF, PF, Inversion and Eversion. Deceased ROM in DF of ankle joint.  Neurological: Sensation intact to light touch.   Assessment:   1. Ingrown left greater toenail      Plan:  Patient was evaluated and treated and all questions answered. Toe was evaluated and appears to be healing well.  May discontinue soaks and neosporin.  Patient to follow-up as needed.    Louann Sjogren, DPM

## 2023-03-26 ENCOUNTER — Ambulatory Visit: Payer: Medicare HMO | Admitting: Family Medicine

## 2023-04-08 ENCOUNTER — Ambulatory Visit: Payer: Medicare HMO | Admitting: Family Medicine
